# Patient Record
Sex: Female | Born: 1960
Health system: Southern US, Community
[De-identification: ages and names within clinical notes are randomized; demographics above are authoritative.]

## PROBLEM LIST (undated history)

## (undated) DIAGNOSIS — Z8042 Family history of malignant neoplasm of prostate: Secondary | ICD-10-CM

## (undated) DIAGNOSIS — C801 Malignant (primary) neoplasm, unspecified: Secondary | ICD-10-CM

## (undated) DIAGNOSIS — Z803 Family history of malignant neoplasm of breast: Secondary | ICD-10-CM

## (undated) DIAGNOSIS — Z789 Other specified health status: Secondary | ICD-10-CM

## (undated) DIAGNOSIS — J302 Other seasonal allergic rhinitis: Secondary | ICD-10-CM

## (undated) DIAGNOSIS — Z800 Family history of malignant neoplasm of digestive organs: Secondary | ICD-10-CM

## (undated) DIAGNOSIS — M1990 Unspecified osteoarthritis, unspecified site: Secondary | ICD-10-CM

## (undated) HISTORY — PX: OTHER SURGICAL HISTORY: SHX169

## (undated) HISTORY — DX: Family history of malignant neoplasm of prostate: Z80.42

## (undated) HISTORY — DX: Family history of malignant neoplasm of digestive organs: Z80.0

## (undated) HISTORY — DX: Family history of malignant neoplasm of breast: Z80.3

## (undated) HISTORY — PX: TONSILLECTOMY: SUR1361

---

## 1998-04-08 ENCOUNTER — Other Ambulatory Visit: Admission: RE | Admit: 1998-04-08 | Discharge: 1998-04-08 | Payer: Self-pay | Admitting: Obstetrics and Gynecology

## 1998-12-20 DIAGNOSIS — C801 Malignant (primary) neoplasm, unspecified: Secondary | ICD-10-CM

## 1998-12-20 HISTORY — PX: OTHER SURGICAL HISTORY: SHX169

## 1998-12-20 HISTORY — DX: Malignant (primary) neoplasm, unspecified: C80.1

## 1999-04-28 ENCOUNTER — Other Ambulatory Visit: Admission: RE | Admit: 1999-04-28 | Discharge: 1999-04-28 | Payer: Self-pay | Admitting: Obstetrics and Gynecology

## 1999-05-19 ENCOUNTER — Other Ambulatory Visit: Admission: RE | Admit: 1999-05-19 | Discharge: 1999-05-19 | Payer: Self-pay | Admitting: Obstetrics & Gynecology

## 1999-05-20 ENCOUNTER — Other Ambulatory Visit: Admission: RE | Admit: 1999-05-20 | Discharge: 1999-05-20 | Payer: Self-pay | Admitting: Obstetrics & Gynecology

## 1999-10-19 ENCOUNTER — Ambulatory Visit (HOSPITAL_COMMUNITY): Admission: RE | Admit: 1999-10-19 | Discharge: 1999-10-19 | Payer: Self-pay | Admitting: Otolaryngology

## 1999-10-19 ENCOUNTER — Encounter: Payer: Self-pay | Admitting: Otolaryngology

## 1999-10-23 ENCOUNTER — Other Ambulatory Visit: Admission: RE | Admit: 1999-10-23 | Discharge: 1999-10-23 | Payer: Self-pay | Admitting: Otolaryngology

## 1999-10-23 ENCOUNTER — Encounter (INDEPENDENT_AMBULATORY_CARE_PROVIDER_SITE_OTHER): Payer: Self-pay | Admitting: Specialist

## 1999-11-04 ENCOUNTER — Inpatient Hospital Stay (HOSPITAL_COMMUNITY): Admission: RE | Admit: 1999-11-04 | Discharge: 1999-11-14 | Payer: Self-pay | Admitting: Otolaryngology

## 1999-11-04 ENCOUNTER — Encounter (INDEPENDENT_AMBULATORY_CARE_PROVIDER_SITE_OTHER): Payer: Self-pay | Admitting: *Deleted

## 1999-11-25 ENCOUNTER — Encounter: Admission: RE | Admit: 1999-11-25 | Discharge: 2000-02-23 | Payer: Self-pay | Admitting: Radiation Oncology

## 2000-01-19 ENCOUNTER — Other Ambulatory Visit: Admission: RE | Admit: 2000-01-19 | Discharge: 2000-01-19 | Payer: Self-pay | Admitting: *Deleted

## 2000-02-25 ENCOUNTER — Encounter (HOSPITAL_COMMUNITY): Admission: RE | Admit: 2000-02-25 | Discharge: 2000-05-25 | Payer: Self-pay | Admitting: Dentistry

## 2000-06-03 ENCOUNTER — Encounter: Payer: Self-pay | Admitting: Otolaryngology

## 2000-06-03 ENCOUNTER — Ambulatory Visit (HOSPITAL_COMMUNITY): Admission: RE | Admit: 2000-06-03 | Discharge: 2000-06-03 | Payer: Self-pay | Admitting: Otolaryngology

## 2000-10-04 ENCOUNTER — Other Ambulatory Visit: Admission: RE | Admit: 2000-10-04 | Discharge: 2000-10-04 | Payer: Self-pay | Admitting: *Deleted

## 2001-10-17 ENCOUNTER — Other Ambulatory Visit: Admission: RE | Admit: 2001-10-17 | Discharge: 2001-10-17 | Payer: Self-pay | Admitting: Obstetrics and Gynecology

## 2001-11-28 ENCOUNTER — Encounter: Payer: Self-pay | Admitting: Otolaryngology

## 2001-11-28 ENCOUNTER — Ambulatory Visit (HOSPITAL_COMMUNITY): Admission: RE | Admit: 2001-11-28 | Discharge: 2001-11-28 | Payer: Self-pay | Admitting: Otolaryngology

## 2002-11-06 ENCOUNTER — Other Ambulatory Visit: Admission: RE | Admit: 2002-11-06 | Discharge: 2002-11-06 | Payer: Self-pay | Admitting: Obstetrics and Gynecology

## 2003-11-18 ENCOUNTER — Other Ambulatory Visit: Admission: RE | Admit: 2003-11-18 | Discharge: 2003-11-18 | Payer: Self-pay | Admitting: Obstetrics and Gynecology

## 2009-06-07 ENCOUNTER — Emergency Department (HOSPITAL_COMMUNITY): Admission: EM | Admit: 2009-06-07 | Discharge: 2009-06-07 | Payer: Self-pay | Admitting: Emergency Medicine

## 2009-06-07 ENCOUNTER — Encounter: Payer: Self-pay | Admitting: Orthopedic Surgery

## 2009-06-11 ENCOUNTER — Ambulatory Visit: Payer: Self-pay | Admitting: Orthopedic Surgery

## 2009-06-11 DIAGNOSIS — S93609A Unspecified sprain of unspecified foot, initial encounter: Secondary | ICD-10-CM | POA: Insufficient documentation

## 2009-06-13 ENCOUNTER — Encounter: Payer: Self-pay | Admitting: Orthopedic Surgery

## 2009-07-31 ENCOUNTER — Ambulatory Visit: Payer: Self-pay | Admitting: Orthopedic Surgery

## 2009-11-23 ENCOUNTER — Encounter: Admission: RE | Admit: 2009-11-23 | Discharge: 2009-11-23 | Payer: Self-pay | Admitting: Otolaryngology

## 2011-03-02 ENCOUNTER — Other Ambulatory Visit: Payer: Self-pay | Admitting: Obstetrics and Gynecology

## 2011-05-07 NOTE — Op Note (Signed)
[facility]  Patient:    Sherry Rivera                        MRN: [id number] Proc. Date: 11/04/99 Adm. 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  OPERATION PERFORMED: 1. Midline labiomandibulotomy with resection of right base of tone adenoid    cystic carcinoma. 2. Transcervical excision of right submandipular gland. 3. Tracheostomy.  SURGEON:  Kinnie Scales. Annalee Genta, M.D.  ASSISTANT:  Veverly Fells. Arletha Grippe, M.D.  ANESTHESIA:  General endotracheal.  ESTIMATED BLOOD LOSS:  150 cc.  URINE OUTPUT:  250 cc.  FLUID REPLACEMENT:  4 liters of crystalloid.  The patient was transferred from the operating room to the recovery room in stable condition without complication.  BRIEF HISTORY:  [name] is a 50 year old white female who was referred by Dr. Kristen Cardinal for evaluation of a right posterior tongue mass.  The patient underwent diagnostic laryngoscopy and biopsy of the mass approximately two weeks prior to her admission.  Biopsy revealed invasive adenoid cystic carcinoma. MRI scan was obtained prior to the biopsy and this showed approximately a 2 cm mass in the midaspect of the right tongue invading the deep tongue musculature.  There as no other adenopathy or abnormalities noted.  Based on the patients history, examination, MRI scan and biopsy, I recommended he undertake the above surgical  procedure for definitive surgical management of the right base of tongue lesion. The risks, benefits, and possible complications of these procedures were discussed in detail with the patient and her family.  They understood and concurred with ur plans for surgery which was scheduled as outlined above.  DESCRIPTION OF PROCEDURE:  The patient  was brought to the operating room on November 04, 1999 and placed in supine position on the operating room.  General  endotracheal anesthesia was established without difficulty.  When the patient had been adequately anesthetized, she was injected with 50 cc of 1% lidocaine 1:100,000 solution epinephrine in subcutaneous fashion in the anterior neck skin and along the proposed surgical incision from the midline aspect of the lower lip to the submental area and to the lateral aspect of the right neck.  The patient was then prepped and draped in sterile fashion and a tracheostomy was then performed with a #15 scalpel blade and a 2 cm horizontal incision was created in the anterior neck. This was carried through the skin and the underlying subcutaneous tissue. Strap muscles were identified and divided in the midline.  They were then lateralized. The anterior aspect of the thyroid gland was identified, divided and suture ligated across the thyroid isthmus.  This allowed access to the anterior aspect of the trachea.  I dissected the tracheal interspace.  A horizontal incision was created with a #15 scalpel blade.  This was carried through the anterior aspect of the trachea and superior inferior stay sutures were then placed.  A anode endotracheal tube was then placed in the patients trachea and sutured in position to the anterior neck skin.  When good ventilation was established, the remainder of the procedure was undertaken.  The patients oral cavity and oropharynx were examined. She  was found to have a large 2 cm firm mass in the right mid and posterior aspect of the tongue.  Previous biopsy site showed a small ulceration.  There was no palpable adenopathy.  Based on the location of the tumor and planned surgical extirpation, the procedure was begun with excision of the right submandibular gland through a right transcervical approach.  A horizontal incision was created  in the pre-existing skin crease approximately 2 to 3 cm below the margin of the mandible on the right hand side.  This was carried through the skin and underlying subcutaneous tissues.  Platysma muscle was identified and divided and a subplatysmal flap was elevated from inferior to superior.  The marginal mandibular nerve was identified.  It was dissected free from the surrounding tissues and followed from proximal to distal preserving the nerve throughout its course with the marginal mandibular nerve identified and preserved.  Attention was then turned to the submandibular space where the right submandibular gland was identified, he common facial vein was divided and suture ligated and reflected superiorly. The inferior margin of the submandibular gland was identified and the fascia surrounding the gland was dissected along the lateral and deep aspects of the gland.  The facial artery was identified and its contributions to the gland were divided and ligated.  The gland was then retracted inferiorly.  The lingual nerve was identified and its glandular ennervation was also divided and suture ligated. The gland was then mobilized and the myelohyoid muscle was freed and elevated medially.  The right submandibular duct was then traced along the floor of mouth and divided and ligated at the anteriormost aspect of the right floor of mouth.  The submandibular gland was then excised entirely.  The submandibular space was  examined.  There were several enlarged lymph nodes which were removed and sent o pathology for microscopic evaluation.  Frozen section showed no evidence of malignancy.  The remainder of the patients incision was then outlined.  This was a midline labial incision with a Z-plasty in the immediate sublabial region.  The  incision was then carried inferiorly to the midline approximately the level of he hyoid and then connected to the previously described lateral neck  incision from the submandibular gland excision.  This carried through the skin and underlying deep subcutaneous tissue to the level of the midline of the mandible.  The superhyoid musculature on the right hand side was elevated and divided using the Bovie  electrocautery.  The hypoglossal nerve and lingual nerve were identified and were traced from proximal to distal and preserved throughout their course.  The right lower central incisor was then mobilized in its socket and the tooth was extracted without difficulty using an oscillating Stryker saw.  A mandibulotomy was then performed in the midline aspect of the mandible.  This was carried through the anterior posterior tables of the mandible and the right hemimandible was then mobilized laterally to allow direct access to the floor of mouth and tongue base. The incision was then extended along the right lateral gingivoglossal sulcus elevating mucosa and dividing and suture ligating vessels maintaining and preserving the lingual artery, nerve and hypoglossal nerve.  The right submandibular duct was transected.  The previous excision site was isolated in he right submandibular space.  The tongue then was mobilized superiorly.  This allowed direct access to the right lateral tongue tumor.  With 2-0 silk sutures for retraction, the tumor mass was palpated and a large cuff of normal tissue measuring  approximately 1 to 0.5 cm was demarcated around the most superficial aspect of he tumor.  Excision was then carried into the deep aspect of the tongue excising all associated tongue musculature around the tumor.  Approximately 3 x 4 cm of right tongue were removed extending from tongue base to the mid and lateral portions f the anterior tongue.  The resected mass was then sent to pathology for gross and microscopic evaluation.  The posterior lateral margin was taken as a frozen section biopsy and this came back negative for evidence  of malignancy.  The wound was then thoroughly irrigated with antibiotic saline solution and suture ligature and Bovie electrocautery were used to maintain hemostasis.  Various reconstructive options were then discussed.  The possibility of a local regional flap versus split thickness skin graft versus local closure.  Based on the mobility of the tongue and the midaspect of the resection, we felt it was adequate for primary closure. This was done in multiple layers with approximately three layers of deep Vicryl sutures to reapproximate the tongue musculature.  This consisted of 3-0 Vicryl suture on a tapered needle.  The tongue mucosa was then closed with the same stitch in an interrupted fashion extending from medial to the lateral sulcus.  The mucosa was then approximated between the lateral aspect of the tongue and the right lateral floor of mouth again with interrupted 3-0 Vicryl sutures.  The deep floor of mouth was then closed in layers reapproximating the superhyoid musculature creating a  watertight seal along the floor of mouth.  A previously fit 6-hole mandibular reconstruction plate with eccentric dynamic compression was placed over the mandibulotomy site and with multiple screws, we were able to close the mandibular defect with good stabilization.  Again, the patients oral cavity and oropharynx  were thoroughly irrigated with antibiotic saline solution.  A 10 mm Jackson-Pratt drain was placed at the base of the neck incision and the wound was closed in multiple layers beginning with reapproximation of the platysma muscle and deep submental tissues.  The mandibular periosteum was closed in layers with 4-0 Vicryl suture as well.  With approximation of the superficial and deep layers, final skin closure was then achieved using a 5-0 Ethilon suture on the neck in a running interlocked fashion.  5-0 and 6-0 Ethilon were used to close the anterior sublabial skin  intraorally.  The wound was closed in 4-0 and 5-0 Vicryl in interrupted fashion.  An orogastric tube was then placed through the left naris and positioning was confirmed.  With aspiration of stomach contents, the nasogastric tube was sutured into position with 2-0 silk through the lateral nasal ala.  The anode endotracheal tube was removed and a #6 Shiley tracheostomy tube was inserted without difficulty.  This was held in position with tracheostomy stay ties. The patient was then awakened from anesthetic.  The oral cavity and oropharynx were  thoroughly irrigated and suctioned with antibiotic solution.  The wound was cleaned and dressed with bacitracin ointment.  She was then transferred from the operating room to the recovery room in stable condition.  There were no complications. Estimated blood loss was 150 cc. DD:  11/04/99 TD:  11/06/99 Job: [id number]

## 2014-03-15 ENCOUNTER — Ambulatory Visit: Payer: Self-pay | Admitting: Family Medicine

## 2016-01-14 NOTE — H&P (Signed)
  NTS SOAP Note  Vital Signs:  Vitals as of: 99991111: Systolic Q000111Q: Diastolic 87: Heart Rate 99: Temp 98.70F (Temporal): Height 57ft 4.5in: Weight 175Lbs 0 Ounces: BMI 29.57   BMI : 29.57 kg/m2  Subjective: This 55 year old female presents for of need for screening TCS.  Denies any lower gi complaints.  Never has had a TCS.  No family h/o colon cancer.  Review of Symptoms:  Constitutional:unremarkable   headache Eyes:unremarkable   Nose/Mouth/Throat:unremarkable Cardiovascular:  unremarkable Respiratory:unremarkable Gastrointestinabdominal pain, dyspepsia Genitourinary:urinary hesitancy Musculoskeletal:unremarkable Skin:unremarkable Hematolgic/Lymphatic:unremarkable   Allergic/Immunologic:unremarkable   Past Medical History:  Reviewed  Past Medical History  Surgical History: tumor excision of tongue 2000 Medical Problems: unremarkable Allergies: nkda Medications: tarmeric, vit d   Social History:Reviewed  Social History  Preferred Language: English Race:  White Ethnicity: Not Hispanic / Latino Age: 55 year Marital Status:  M Alcohol: no   Smoking Status: Current every day smoker reviewed on 01/01/2016 Started Date:  Packs per week:  Functional Status reviewed on 01/01/2016 ------------------------------------------------ Bathing: Normal Cooking: Normal Dressing: Normal Driving: Normal Eating: Normal Managing Meds: Normal Oral Care: Normal Shopping: Normal Toileting: Normal Transferring: Normal Walking: Normal Cognitive Status reviewed on 01/01/2016 ------------------------------------------------ Attention: Normal Decision Making: Normal Language: Normal Memory: Normal Motor: Normal Perception: Normal Problem Solving: Normal Visual and Spatial: Normal   Family History:Reviewed  Family Health History Mother, Living; Breast cancer;  Father, Living; Urinary bladder cancer; Prostate cancer;     Objective  Information: General:Well appearing, well nourished in no distress. Heart:RRR, no murmur Lungs:  CTA bilaterally, no wheezes, rhonchi, rales.  Breathing unlabored. Abdomen:Soft, NT/ND, no HSM, no masses. deferred to procedure  Assessment:Need for screening TCS  Diagnoses: V76.51  Z12.11 Screening for malignant neoplasm of colon (Encounter for screening for malignant neoplasm of colon)  Procedures: [id number] - OFFICE OUTPATIENT NEW 20 MINUTES    Plan:  Scheduled for screening TCS on 02/10/16.   Patient Education:Alternative treatments to surgery were discussed with patient (and family).  Risks and benefits  of procedure incluidng bleeding and perforation were fully explained to the patient (and family) who gave informed consent. Patient/family questions were addressed.  Follow-up:Pending Surgery

## 2016-02-10 ENCOUNTER — Encounter (HOSPITAL_COMMUNITY): Payer: Self-pay | Admitting: *Deleted

## 2016-02-10 ENCOUNTER — Encounter (HOSPITAL_COMMUNITY)
Admission: RE | Disposition: A | Discharge status: Home / Self Care | Payer: Self-pay | Source: Ambulatory Visit | Attending: General Surgery

## 2016-02-10 ENCOUNTER — Ambulatory Visit (HOSPITAL_COMMUNITY)
Admission: RE | Admit: 2016-02-10 | Discharge: 2016-02-10 | Disposition: A | Discharge status: Home / Self Care | Payer: 59 | Source: Ambulatory Visit | Attending: General Surgery | Admitting: General Surgery

## 2016-02-10 DIAGNOSIS — K5730 Diverticulosis of large intestine without perforation or abscess without bleeding: Secondary | ICD-10-CM | POA: Insufficient documentation

## 2016-02-10 DIAGNOSIS — D128 Benign neoplasm of rectum: Secondary | ICD-10-CM | POA: Insufficient documentation

## 2016-02-10 DIAGNOSIS — F17200 Nicotine dependence, unspecified, uncomplicated: Secondary | ICD-10-CM | POA: Diagnosis not present

## 2016-02-10 DIAGNOSIS — Z1211 Encounter for screening for malignant neoplasm of colon: Secondary | ICD-10-CM | POA: Insufficient documentation

## 2016-02-10 DIAGNOSIS — D123 Benign neoplasm of transverse colon: Secondary | ICD-10-CM | POA: Insufficient documentation

## 2016-02-10 HISTORY — DX: Other specified health status: Z78.9

## 2016-02-10 HISTORY — PX: COLONOSCOPY: SHX5424

## 2016-02-10 SURGERY — COLONOSCOPY
Anesthesia: Moderate Sedation

## 2016-02-10 MED ORDER — ATROPINE SULFATE 1 MG/ML IJ SOLN
INTRAMUSCULAR | Status: DC | PRN
  Administered 2016-02-10: .5 mg via INTRAVENOUS

## 2016-02-10 MED ORDER — ATROPINE SULFATE 1 MG/ML IJ SOLN
INTRAMUSCULAR | Status: AC
  Filled 2016-02-10: qty 1

## 2016-02-10 MED ORDER — MIDAZOLAM HCL 5 MG/5ML IJ SOLN
INTRAMUSCULAR | Status: AC
  Filled 2016-02-10: qty 5

## 2016-02-10 MED ORDER — MEPERIDINE HCL 50 MG/ML IJ SOLN
INTRAMUSCULAR | Status: DC | PRN
  Administered 2016-02-10: 50 mg via INTRAVENOUS

## 2016-02-10 MED ORDER — STERILE WATER FOR IRRIGATION IR SOLN
Status: DC | PRN
  Administered 2016-02-10: 08:00:00

## 2016-02-10 MED ORDER — MEPERIDINE HCL 50 MG/ML IJ SOLN
INTRAMUSCULAR | Status: AC
  Filled 2016-02-10: qty 1

## 2016-02-10 MED ORDER — SODIUM CHLORIDE 0.9 % IV SOLN
INTRAVENOUS | Status: DC
  Administered 2016-02-10: 07:00:00 via INTRAVENOUS

## 2016-02-10 MED ORDER — MIDAZOLAM HCL 5 MG/5ML IJ SOLN
INTRAMUSCULAR | Status: DC | PRN
  Administered 2016-02-10: 3 mg via INTRAVENOUS

## 2016-02-10 NOTE — Interval H&P Note (Signed)
History and Physical Interval Note:  02/10/2016 7:31 AM  Sherry Rivera  has presented today for surgery, with the diagnosis of screening  The various methods of treatment have been discussed with the patient and family. After consideration of risks, benefits and other options for treatment, the patient has consented to  Procedure(s): COLONOSCOPY (N/A) as a surgical intervention .  The patient's history has been reviewed, patient examined, no change in status, stable for surgery.  I have reviewed the patient's chart and labs.  Questions were answered to the patient's satisfaction.     Aviva Signs A

## 2016-02-10 NOTE — Discharge Instructions (Signed)
Colonoscopy, Care After °Refer to this sheet in the next few weeks. These instructions provide you with information on caring for yourself after your procedure. Your health care provider may also give you more specific instructions. Your treatment has been planned according to current medical practices, but problems sometimes occur. Call your health care provider if you have any problems or questions after your procedure. °WHAT TO EXPECT AFTER THE PROCEDURE  °After your procedure, it is typical to have the following: °· A small amount of blood in your stool. °· Moderate amounts of gas and mild abdominal cramping or bloating. °HOME CARE INSTRUCTIONS °· Do not drive, operate machinery, or sign important documents for 24 hours. °· You may shower and resume your regular physical activities, but move at a slower pace for the first 24 hours. °· Take frequent rest periods for the first 24 hours. °· Walk around or put a warm pack on your abdomen to help reduce abdominal cramping and bloating. °· Drink enough fluids to keep your urine clear or pale yellow. °· You may resume your normal diet as instructed by your health care provider. Avoid heavy or fried foods that are hard to digest. °· Avoid drinking alcohol for 24 hours or as instructed by your health care provider. °· Only take over-the-counter or prescription medicines as directed by your health care provider. °· If a tissue sample (biopsy) was taken during your procedure: °¨ Do not take aspirin or blood thinners for 7 days, or as instructed by your health care provider. °¨ Do not drink alcohol for 7 days, or as instructed by your health care provider. °¨ Eat soft foods for the first 24 hours. °SEEK MEDICAL CARE IF: °You have persistent spotting of blood in your stool 2-3 days after the procedure. °SEEK IMMEDIATE MEDICAL CARE IF: °· You have more than a small spotting of blood in your stool. °· You pass large blood clots in your stool. °· Your abdomen is swollen  (distended). °· You have nausea or vomiting. °· You have a fever. °· You have increasing abdominal pain that is not relieved with medicine. °  °This information is not intended to replace advice given to you by your health care provider. Make sure you discuss any questions you have with your health care provider. °  °Document Released: 07/20/2004 Document Revised: 09/26/2013 Document Reviewed: 08/13/2013 °Elsevier Interactive Patient Education ©2016 [employer] ° °

## 2016-02-10 NOTE — Op Note (Signed)
[place], [address]   COLONOSCOPY PROCEDURE REPORT     EXAM DATE: 2016/03/06  PATIENT NAME:      Sherry Rivera, Sherry Rivera           MR #:      [id number]  BIRTHDATE:       Oct 11, 1961      VISIT #:     [phone]  ATTENDING:     Aviva Signs, MD     STATUS:     outpatient ASSISTANT:  INDICATIONS:  The patient is a 55 yr old female here for a colonoscopy due to average risk patient for colon cancer. PROCEDURE PERFORMED:     Colonoscopy with snare polypectomy MEDICATIONS:     Demerol 50 mg IV, Versed 3 mg IV, and Atropine .5 mg IV ESTIMATED BLOOD LOSS:     None  CONSENT: The patient understands the risks and benefits of the procedure and understands that these risks include, but are not limited to: sedation, allergic reaction, infection, perforation and/or bleeding. Alternative means of evaluation and treatment include, among others: physical exam, x-rays, and/or surgical intervention. The patient elects to proceed with this endoscopic procedure.  DESCRIPTION OF PROCEDURE: During intra-op preparation period all mechanical & medical equipment was checked for proper function. Hand hygiene and appropriate measures for infection prevention was taken. After the risks, benefits and alternatives of the procedure were thoroughly explained, Informed consent was verified, confirmed and timeout was successfully executed by the treatment team. A digital exam revealed no abnormalities of the rectum. The [id number] endoscope was introduced through the anus and advanced to the cecum, which was identified by both the appendix and ileocecal valve. adequate (Trilyte was used) The instrument was then slowly withdrawn as the colon was fully examined.Estimated blood loss is zero unless otherwise noted in this procedure report.   COLON FINDINGS: There was mild diverticulosis noted in the descending colon.   A sessile polyp ranging between 3-58mm in  size with a friable surface was found in the distal transverse colon.  A polypectomy was performed using snare cautery.  The resection was complete, the polyp tissue was completely retrieved and sent to histology.   Three sessile polyps ranging between 3-51mm in size with friable surfaces were found in the rectum.  Polypectomies were performed using snare cautery.  The resection was complete, the polyp tissue was completely retrieved and sent to histology.   The examination was otherwise normal. Retroflexion was not performed due to a narrow rectal vault. The scope was then completely withdrawn from the patient and the procedure terminated. SCOPE WITHDRAWAL TIME: 16    ADVERSE EVENTS:      There were no immediate complications.  IMPRESSIONS:     1.  Mild diverticulosis was noted in the descending colon 2.  Sessile polyp ranging between 3-87mm in size was found in the distal transverse colon; polypectomy was performed using snare cautery 3.  Three sessile polyps ranging between 3-83mm in size were found in the rectum; polypectomies were performed using snare cautery 4.  The examination was otherwise normal  RECOMMENDATIONS:     1.  Await pathology results 2.  Repeat Colonoscopy in 3 years. RECALL:  _____________________________ Aviva Signs, MD eSigned:  Aviva Signs, MD 2016-03-06 8:03 AM   cc:   CPT CODES: ICD CODES:  The ICD and CPT codes recommended by this software are interpretations from the data that the clinical staff has captured with the software.  The verification  of the translation of this report to the ICD and CPT codes and modifiers is the sole responsibility of the health care institution and practicing physician where this report was generated.  [employer] will not be held responsible for the validity of the ICD and CPT codes included on this report.  AMA assumes no liability for data contained or not contained herein. CPT is a  Designer, television/film set of the [employer].   PATIENT NAME:  [name], [name] MR#: [id number]

## 2016-02-10 NOTE — Progress Notes (Signed)
Per VO Dr Arnoldo Morale; give 0.5 mg atropine now IV for decreased heartrate of 53; confirmed verbal order and administered atropine as ordered.

## 2016-02-12 ENCOUNTER — Encounter (HOSPITAL_COMMUNITY): Payer: Self-pay | Admitting: General Surgery

## 2017-01-22 DIAGNOSIS — Z803 Family history of malignant neoplasm of breast: Secondary | ICD-10-CM | POA: Diagnosis not present

## 2017-01-22 DIAGNOSIS — Z1231 Encounter for screening mammogram for malignant neoplasm of breast: Secondary | ICD-10-CM | POA: Diagnosis not present

## 2017-07-04 DIAGNOSIS — Z01419 Encounter for gynecological examination (general) (routine) without abnormal findings: Secondary | ICD-10-CM | POA: Diagnosis not present

## 2017-08-25 DIAGNOSIS — L821 Other seborrheic keratosis: Secondary | ICD-10-CM | POA: Diagnosis not present

## 2017-08-25 DIAGNOSIS — C44329 Squamous cell carcinoma of skin of other parts of face: Secondary | ICD-10-CM | POA: Diagnosis not present

## 2017-08-25 DIAGNOSIS — L720 Epidermal cyst: Secondary | ICD-10-CM | POA: Diagnosis not present

## 2017-10-07 DIAGNOSIS — Z23 Encounter for immunization: Secondary | ICD-10-CM | POA: Diagnosis not present

## 2017-10-13 DIAGNOSIS — Z08 Encounter for follow-up examination after completed treatment for malignant neoplasm: Secondary | ICD-10-CM | POA: Diagnosis not present

## 2017-10-13 DIAGNOSIS — D2239 Melanocytic nevi of other parts of face: Secondary | ICD-10-CM | POA: Diagnosis not present

## 2017-10-13 DIAGNOSIS — Z85828 Personal history of other malignant neoplasm of skin: Secondary | ICD-10-CM | POA: Diagnosis not present

## 2018-01-27 DIAGNOSIS — Z803 Family history of malignant neoplasm of breast: Secondary | ICD-10-CM | POA: Diagnosis not present

## 2018-01-27 DIAGNOSIS — Z1231 Encounter for screening mammogram for malignant neoplasm of breast: Secondary | ICD-10-CM | POA: Diagnosis not present

## 2018-07-20 DIAGNOSIS — Z01419 Encounter for gynecological examination (general) (routine) without abnormal findings: Secondary | ICD-10-CM | POA: Diagnosis not present

## 2018-08-11 ENCOUNTER — Other Ambulatory Visit (HOSPITAL_COMMUNITY): Payer: Self-pay | Admitting: Family Medicine

## 2018-08-11 DIAGNOSIS — Z0001 Encounter for general adult medical examination with abnormal findings: Secondary | ICD-10-CM | POA: Diagnosis not present

## 2018-08-11 DIAGNOSIS — R0989 Other specified symptoms and signs involving the circulatory and respiratory systems: Secondary | ICD-10-CM

## 2018-08-14 DIAGNOSIS — Z0001 Encounter for general adult medical examination with abnormal findings: Secondary | ICD-10-CM | POA: Diagnosis not present

## 2018-08-14 DIAGNOSIS — Z1389 Encounter for screening for other disorder: Secondary | ICD-10-CM | POA: Diagnosis not present

## 2018-08-14 DIAGNOSIS — R739 Hyperglycemia, unspecified: Secondary | ICD-10-CM | POA: Diagnosis not present

## 2018-08-14 DIAGNOSIS — R0989 Other specified symptoms and signs involving the circulatory and respiratory systems: Secondary | ICD-10-CM | POA: Diagnosis not present

## 2018-08-22 ENCOUNTER — Other Ambulatory Visit (HOSPITAL_COMMUNITY): Payer: Self-pay | Admitting: Family Medicine

## 2018-08-22 ENCOUNTER — Ambulatory Visit (HOSPITAL_COMMUNITY)
Admission: RE | Admit: 2018-08-22 | Discharge: 2018-08-22 | Disposition: A | Discharge status: Home / Self Care | Payer: 59 | Source: Ambulatory Visit | Attending: Family Medicine | Admitting: Family Medicine

## 2018-08-22 DIAGNOSIS — I700 Atherosclerosis of aorta: Secondary | ICD-10-CM | POA: Insufficient documentation

## 2018-08-22 DIAGNOSIS — R0989 Other specified symptoms and signs involving the circulatory and respiratory systems: Secondary | ICD-10-CM | POA: Insufficient documentation

## 2018-09-20 DIAGNOSIS — Z23 Encounter for immunization: Secondary | ICD-10-CM | POA: Diagnosis not present

## 2019-01-29 DIAGNOSIS — Z1231 Encounter for screening mammogram for malignant neoplasm of breast: Secondary | ICD-10-CM | POA: Diagnosis not present

## 2019-02-09 DIAGNOSIS — R922 Inconclusive mammogram: Secondary | ICD-10-CM | POA: Diagnosis not present

## 2019-02-09 DIAGNOSIS — N6489 Other specified disorders of breast: Secondary | ICD-10-CM | POA: Diagnosis not present

## 2019-02-09 DIAGNOSIS — N6312 Unspecified lump in the right breast, upper inner quadrant: Secondary | ICD-10-CM | POA: Diagnosis not present

## 2019-02-18 DIAGNOSIS — C801 Malignant (primary) neoplasm, unspecified: Secondary | ICD-10-CM

## 2019-02-18 HISTORY — DX: Malignant (primary) neoplasm, unspecified: C80.1

## 2019-02-19 ENCOUNTER — Other Ambulatory Visit: Payer: Self-pay | Admitting: Radiology

## 2019-02-19 DIAGNOSIS — C50811 Malignant neoplasm of overlapping sites of right female breast: Secondary | ICD-10-CM | POA: Diagnosis not present

## 2019-02-19 DIAGNOSIS — N6315 Unspecified lump in the right breast, overlapping quadrants: Secondary | ICD-10-CM | POA: Diagnosis not present

## 2019-02-21 ENCOUNTER — Telehealth: Payer: Self-pay | Admitting: Hematology and Oncology

## 2019-02-21 NOTE — Telephone Encounter (Signed)
Spoke to patient to confirm morning [facility] appointment for 3/11, [facility] patient no packet sent

## 2019-02-26 ENCOUNTER — Encounter: Payer: Self-pay | Admitting: *Deleted

## 2019-02-26 DIAGNOSIS — Z170 Estrogen receptor positive status [ER+]: Principal | ICD-10-CM

## 2019-02-26 DIAGNOSIS — C50411 Malignant neoplasm of upper-outer quadrant of right female breast: Secondary | ICD-10-CM

## 2019-02-27 ENCOUNTER — Encounter: Payer: Self-pay | Admitting: Licensed Clinical Social Worker

## 2019-02-27 NOTE — Progress Notes (Signed)
[place] CONSULT NOTE  Patient Care Team: [place], [place] as PCP - General (Family Medicine) Mauro Kaufmann, RN as Oncology Nurse Navigator Rockwell Germany, RN as Oncology Nurse Navigator  CHIEF COMPLAINTS/PURPOSE OF CONSULTATION: Newly diagnosed breast cancer  HISTORY OF PRESENTING ILLNESS:  Sherry Rivera 58 y.o. female is here because of recent diagnosis of invasive ductal carcinoma of the right breast. The cancer was detected on a screening mammogram on 01/29/19 and was not palpable prior to diagnosis. A diagnostic mammogram and [place] from 02/09/19 showed a 1.5cm distortion in the right breast and US showed no abnormalities in the right axilla. A biopsy from 02/19/19 showed the cancer to be grade 2 IDC HER2 negative, ER 95%, PR 40%, Ki67 5%.   She presents to the clinic today with her husband. She reports a family history of breast cancer in her mother at 16 and paternal aunt at 39. She previously had tongue cancer in 2000 treated with surgery and radiation.   I reviewed her records extensively and collaborated the history with the patient.  SUMMARY OF ONCOLOGIC HISTORY:   Malignant neoplasm of upper-outer quadrant of right breast in female, estrogen receptor positive ([place])   02/19/2019 Initial Diagnosis    Screening detected right breast asymmetry by ultrasound measured 7 mm, biopsy revealed grade 2 IDC ER 95%, PR 40%, Ki-67 5%, HER-2 1+ negative, T1BN0 stage Ia clinical stage    02/28/2019 Cancer Staging    Staging form: Breast, AJCC 8th Edition - Clinical stage from 02/28/2019: Stage IA (cT1b, cN0, cM0, G2, ER+, PR+, HER2-) - Signed by Nicholas Lose, MD on 02/28/2019     MEDICAL HISTORY:  Past Medical History:  Diagnosis Date  . Medical history non-contributory     SURGICAL HISTORY: Past Surgical History:  Procedure Laterality Date  . COLONOSCOPY N/A 02/10/2016   Procedure: COLONOSCOPY;  Surgeon: Aviva Signs, MD;  Location: [facility];   Service: Gastroenterology;  Laterality: N/A;  . laser surgery on cervix    . TONSILLECTOMY    . tumor on tongue removed  2000    SOCIAL HISTORY: Social History   Socioeconomic History  . Marital status: Married    Spouse name: Not on file  . Number of children: Not on file  . Years of education: Not on file  . Highest education level: Not on file  Occupational History  . Not on file  Social Needs  . Financial resource strain: Not on file  . Food insecurity:    Worry: Not on file    Inability: Not on file  . Transportation needs:    Medical: Not on file    Non-medical: Not on file  Tobacco Use  . Smoking status: Former Smoker    Packs/day: 1.00    Years: 36.00    Pack years: 36.00    Types: Cigarettes    Last attempt to quit: 02/18/2019    Years since quitting: 0.0  . Smokeless tobacco: Never Used  Substance and Sexual Activity  . Alcohol use: Not Currently    Comment: Rarely  . Drug use: No  . Sexual activity: Not on file  Lifestyle  . Physical activity:    Days per week: Not on file    Minutes per session: Not on file  . Stress: Not on file  Relationships  . Social connections:    Talks on phone: Not on file    Gets together: Not on file    Attends religious  service: Not on file    Active member of club or organization: Not on file    Attends meetings of clubs or organizations: Not on file    Relationship status: Not on file  . Intimate partner violence:    Fear of current or ex partner: Not on file    Emotionally abused: Not on file    Physically abused: Not on file    Forced sexual activity: Not on file  Other Topics Concern  . Not on file  Social History Narrative  . Not on file    FAMILY HISTORY: Family History  Problem Relation Age of Onset  . Colon cancer Other   . Breast cancer Mother   . Bladder Cancer Father   . Prostate cancer Maternal Grandfather   . Colon cancer Paternal Grandmother   . Breast cancer Paternal Aunt   . Breast cancer  Cousin     ALLERGIES:  has No Known Allergies.  MEDICATIONS:  Current Outpatient Medications  Medication Sig Dispense Refill  . acetaminophen (TYLENOL) 500 MG tablet Take 1,000 mg by mouth every 6 (six) hours as needed for headache.    . calcium carbonate (OSCAL) 1500 (600 Ca) MG TABS tablet Take 600 mg of elemental calcium by mouth daily.    Marland Kitchen glucosamine-chondroitin 500-400 MG tablet Take 1 tablet by mouth 3 (three) times daily.    . nicotine polacrilex (COMMIT) 2 MG lozenge Take 2 mg by mouth as needed for smoking cessation.    [name] 3-6-9 Fatty Acids (OMEGA 3-6-9 PO) Take 1 capsule by mouth daily.    . pseudoephedrine (SUDAFED) 60 MG tablet Take 60 mg by mouth every 4 (four) hours as needed for congestion.    . TURMERIC PO Take 1 tablet by mouth daily.     No current facility-administered medications for this visit.     REVIEW OF SYSTEMS:   Constitutional: Denies fevers, chills or abnormal night sweats Eyes: Denies blurriness of vision, double vision or watery eyes Ears, nose, mouth, throat, and face: Denies mucositis or sore throat Respiratory: Denies cough, dyspnea or wheezes Cardiovascular: Denies palpitation, chest discomfort or lower extremity swelling Gastrointestinal:  Denies nausea, heartburn or change in bowel habits Skin: Denies abnormal skin rashes Lymphatics: Denies new lymphadenopathy or easy bruising Neurological:Denies numbness, tingling or new weaknesses Behavioral/Psych: Mood is stable, no new changes  Breast: Denies any palpable lumps or discharge All other systems were reviewed with the patient and are negative.  PHYSICAL EXAMINATION: ECOG PERFORMANCE STATUS: 1 - Symptomatic but completely ambulatory  Vitals:   02/28/19 0841  BP: (!) 141/85  Pulse: 90  Resp: 18  Temp: 97.6 F (36.4 C)  SpO2: 99%   Filed Weights   02/28/19 0841  Weight: 168 lb 12.8 oz (76.6 kg)    GENERAL:alert, no distress and comfortable SKIN: skin color, texture, turgor  are normal, no rashes or significant lesions EYES: normal, conjunctiva are pink and non-injected, sclera clear OROPHARYNX:no exudate, no erythema and lips, buccal mucosa, and tongue normal  NECK: supple, thyroid normal size, non-tender, without nodularity LYMPH:  no palpable lymphadenopathy in the cervical, axillary or inguinal LUNGS: clear to auscultation and percussion with normal breathing effort HEART: regular rate & rhythm and no murmurs and no lower extremity edema ABDOMEN:abdomen soft, non-tender and normal bowel sounds Musculoskeletal:no cyanosis of digits and no clubbing  PSYCH: alert & oriented x 3 with fluent speech NEURO: no focal motor/sensory deficits BREAST: No palpable nodules in breast. No palpable axillary or supraclavicular  lymphadenopathy (exam performed in the presence of a chaperone)   LABORATORY DATA:  I have reviewed the data as listed Lab Results  Component Value Date   WBC 6.5 02/28/2019   HGB 13.8 02/28/2019   HCT 42.8 02/28/2019   MCV 94.9 02/28/2019   PLT 235 02/28/2019   Lab Results  Component Value Date   NA 144 02/28/2019   K 4.2 02/28/2019   CL 109 02/28/2019   CO2 25 02/28/2019    RADIOGRAPHIC STUDIES: I have personally reviewed the radiological reports and agreed with the findings in the report.  ASSESSMENT AND PLAN:  Malignant neoplasm of upper-outer quadrant of right breast in female, estrogen receptor positive ([place]) 02/19/2019:Screening detected right breast asymmetry by ultrasound measured 7 mm, biopsy revealed grade 2 IDC ER 95%, PR 40%, Ki-67 5%, HER-2 1+ negative, T1BN0 stage Ia clinical stage  Pathology and radiology counseling:Discussed with the patient, the details of pathology including the type of breast cancer,the clinical staging, the significance of ER, PR and HER-2/neu receptors and the implications for treatment. After reviewing the pathology in detail, we proceeded to discuss the different treatment options between surgery,  radiation, chemotherapy, antiestrogen therapies.  Recommendations: 1. Breast conserving surgery followed by 2. Oncotype DX testing to determine if chemotherapy would be of any benefit followed by 3. Adjuvant radiation therapy followed by 4. Adjuvant antiestrogen therapy  Oncotype counseling: I discussed Oncotype DX test. I explained to the patient that this is a 21 gene panel to evaluate patient tumors DNA to calculate recurrence score. This would help determine whether patient has high risk or intermediate risk or low risk breast cancer. She understands that if her tumor was found to be high risk, she would benefit from systemic chemotherapy. If low risk, no need of chemotherapy. If she was found to be intermediate risk, we would need to evaluate the score as well as other risk factors and determine if an abbreviated chemotherapy may be of benefit.  Return to clinic after surgery to discuss final pathology report and then determine if Oncotype DX testing will need to be sent.  All questions were answered. The patient knows to call the clinic with any problems, questions or concerns.  Nicholas Lose, MD 02/28/2019   I, Cloyde Reams Dorshimer, am acting as scribe for Nicholas Lose, MD.  I have reviewed the above documentation for accuracy and completeness, and I agree with the above.

## 2019-02-28 ENCOUNTER — Encounter: Payer: Self-pay | Admitting: Hematology and Oncology

## 2019-02-28 ENCOUNTER — Ambulatory Visit
Admission: RE | Admit: 2019-02-28 | Discharge: 2019-02-28 | Disposition: A | Discharge status: Home / Self Care | Payer: 59 | Source: Ambulatory Visit | Attending: Radiation Oncology | Admitting: Radiation Oncology

## 2019-02-28 ENCOUNTER — Inpatient Hospital Stay: Payer: 59

## 2019-02-28 ENCOUNTER — Encounter: Payer: Self-pay | Admitting: Radiation Oncology

## 2019-02-28 ENCOUNTER — Ambulatory Visit (HOSPITAL_BASED_OUTPATIENT_CLINIC_OR_DEPARTMENT_OTHER): Payer: 59 | Admitting: Licensed Clinical Social Worker

## 2019-02-28 ENCOUNTER — Ambulatory Visit: Payer: 59 | Attending: General Surgery | Admitting: Physical Therapy

## 2019-02-28 ENCOUNTER — Other Ambulatory Visit: Payer: Self-pay | Admitting: General Surgery

## 2019-02-28 ENCOUNTER — Inpatient Hospital Stay (HOSPITAL_BASED_OUTPATIENT_CLINIC_OR_DEPARTMENT_OTHER): Payer: 59 | Admitting: Hematology and Oncology

## 2019-02-28 ENCOUNTER — Other Ambulatory Visit: Payer: Self-pay

## 2019-02-28 ENCOUNTER — Encounter: Payer: Self-pay | Admitting: Physical Therapy

## 2019-02-28 ENCOUNTER — Encounter: Payer: Self-pay | Admitting: Licensed Clinical Social Worker

## 2019-02-28 DIAGNOSIS — Z170 Estrogen receptor positive status [ER+]: Principal | ICD-10-CM

## 2019-02-28 DIAGNOSIS — Z85810 Personal history of malignant neoplasm of tongue: Secondary | ICD-10-CM

## 2019-02-28 DIAGNOSIS — C50411 Malignant neoplasm of upper-outer quadrant of right female breast: Secondary | ICD-10-CM | POA: Insufficient documentation

## 2019-02-28 DIAGNOSIS — Z923 Personal history of irradiation: Secondary | ICD-10-CM | POA: Diagnosis not present

## 2019-02-28 DIAGNOSIS — Z803 Family history of malignant neoplasm of breast: Secondary | ICD-10-CM | POA: Diagnosis not present

## 2019-02-28 DIAGNOSIS — R293 Abnormal posture: Secondary | ICD-10-CM | POA: Insufficient documentation

## 2019-02-28 DIAGNOSIS — C50211 Malignant neoplasm of upper-inner quadrant of right female breast: Secondary | ICD-10-CM | POA: Diagnosis not present

## 2019-02-28 DIAGNOSIS — Z8042 Family history of malignant neoplasm of prostate: Secondary | ICD-10-CM | POA: Insufficient documentation

## 2019-02-28 DIAGNOSIS — Z800 Family history of malignant neoplasm of digestive organs: Secondary | ICD-10-CM

## 2019-02-28 DIAGNOSIS — Z1379 Encounter for other screening for genetic and chromosomal anomalies: Secondary | ICD-10-CM

## 2019-02-28 LAB — CBC WITH DIFFERENTIAL (CANCER CENTER ONLY)
Abs Immature Granulocytes: 0.01 10*3/uL (ref 0.00–0.07)
BASOS ABS: 0 10*3/uL (ref 0.0–0.1)
Basophils Relative: 1 %
Eosinophils Absolute: 0.1 10*3/uL (ref 0.0–0.5)
Eosinophils Relative: 1 %
HCT: 42.8 % (ref 36.0–46.0)
HEMOGLOBIN: 13.8 g/dL (ref 12.0–15.0)
IMMATURE GRANULOCYTES: 0 %
LYMPHS PCT: 39 %
Lymphs Abs: 2.5 10*3/uL (ref 0.7–4.0)
MCH: 30.6 pg (ref 26.0–34.0)
MCHC: 32.2 g/dL (ref 30.0–36.0)
MCV: 94.9 fL (ref 80.0–100.0)
Monocytes Absolute: 0.5 10*3/uL (ref 0.1–1.0)
Monocytes Relative: 8 %
Neutro Abs: 3.4 10*3/uL (ref 1.7–7.7)
Neutrophils Relative %: 51 %
PLATELETS: 235 10*3/uL (ref 150–400)
RBC: 4.51 MIL/uL (ref 3.87–5.11)
RDW: 12.1 % (ref 11.5–15.5)
WBC: 6.5 10*3/uL (ref 4.0–10.5)
nRBC: 0 % (ref 0.0–0.2)

## 2019-02-28 LAB — CMP (CANCER CENTER ONLY)
ALBUMIN: 3.9 g/dL (ref 3.5–5.0)
ALT: 8 U/L (ref 0–44)
ANION GAP: 10 (ref 5–15)
AST: 11 U/L — ABNORMAL LOW (ref 15–41)
Alkaline Phosphatase: 54 U/L (ref 38–126)
BILIRUBIN TOTAL: 0.6 mg/dL (ref 0.3–1.2)
BUN: 12 mg/dL (ref 6–20)
CHLORIDE: 109 mmol/L (ref 98–111)
CO2: 25 mmol/L (ref 22–32)
Calcium: 8.8 mg/dL — ABNORMAL LOW (ref 8.9–10.3)
Creatinine: 0.74 mg/dL (ref 0.44–1.00)
GFR, Est AFR Am: >60 mL/min (ref >60)
GFR, Estimated: >60 mL/min (ref >60)
GLUCOSE: 111 mg/dL — AB (ref 70–99)
POTASSIUM: 4.2 mmol/L (ref 3.5–5.1)
SODIUM: 144 mmol/L (ref 135–145)
TOTAL PROTEIN: 6.8 g/dL (ref 6.5–8.1)

## 2019-02-28 NOTE — Assessment & Plan Note (Addendum)
02/19/2019:Screening detected right breast asymmetry by ultrasound measured 7 mm, biopsy revealed grade 2 IDC ER 95%, PR 40%, Ki-67 5%, HER-2 1+ negative, T1BN0 stage Ia clinical stage  Pathology and radiology counseling:Discussed with the patient, the details of pathology including the type of breast cancer,the clinical staging, the significance of ER, PR and HER-2/neu receptors and the implications for treatment. After reviewing the pathology in detail, we proceeded to discuss the different treatment options between surgery, radiation, chemotherapy, antiestrogen therapies.  Recommendations: 1. Breast conserving surgery followed by 2. Oncotype DX testing to determine if chemotherapy would be of any benefit followed by 3. Adjuvant radiation therapy followed by 4. Adjuvant antiestrogen therapy  Oncotype counseling: I discussed Oncotype DX test. I explained to the patient that this is a 21 gene panel to evaluate patient tumors DNA to calculate recurrence score. This would help determine whether patient has high risk or intermediate risk or low risk breast cancer. She understands that if her tumor was found to be high risk, she would benefit from systemic chemotherapy. If low risk, no need of chemotherapy. If she was found to be intermediate risk, we would need to evaluate the score as well as other risk factors and determine if an abbreviated chemotherapy may be of benefit.  Return to clinic after surgery to discuss final pathology report and then determine if Oncotype DX testing will need to be sent.

## 2019-02-28 NOTE — Patient Instructions (Signed)

## 2019-02-28 NOTE — Progress Notes (Unsigned)
Nutrition Assessment  Reason for Assessment:  Pt seen in Breast Clinic  ASSESSMENT:   58 year old female with new diagnosis of breast cancer.  Past medical history reviewed.  Patient reports normal appetite.  Medications:  reviewed  Labs: reviewed  Anthropometrics:   Height: 65 inches Weight: 168 lb  BMI: 28   NUTRITION DIAGNOSIS: Food and nutrition related knowledge deficit related to new diagnosis of breast cancer as evidenced by no prior need for nutrition related information.  INTERVENTION:   Discussed and provided packet of information regarding nutritional tips for breast cancer patients.  Questions answered.  Teachback method used.  Contact information provided and patient knows to contact me with questions/concerns.    MONITORING, EVALUATION, and GOAL: Pt will consume a healthy plant based diet to maintain lean body mass throughout treatment.   Dazani Norby B. Zenia Resides, [place], [place] Registered Dietitian [phone] (pager)

## 2019-02-28 NOTE — Progress Notes (Signed)
Radiation Oncology         [phone] ________________________________  Initial outpatient Consultation  Name: Sherry Rivera MRN: [id number]  Date: 02/28/2019  DOB: 25-Jun-1961  QX:[facility], [facility]  Rolm Bookbinder, MD   REFERRING PHYSICIAN: Rolm Bookbinder, MD  DIAGNOSIS:    ICD-10-CM   1. Malignant neoplasm of upper-outer quadrant of right breast in female, estrogen receptor positive ([place]) C50.411    Z17.0    Cancer Staging Malignant neoplasm of upper-outer quadrant of right breast in female, estrogen receptor positive ([place]) Staging form: Breast, AJCC 8th Edition - Clinical stage from 02/28/2019: Stage IA (cT1b, cN0, cM0, G2, ER+, PR+, HER2-) - Signed by Nicholas Lose, MD on 02/28/2019   CHIEF COMPLAINT: Here to discuss management of breast cancer  HISTORY OF PRESENT ILLNESS::Sherry Rivera is a 58 y.o. female who presented with breast abnormality on the following imaging: Right breast asymmetry on mammogram, screening.  She did not palpate anything at that time.  Ultrasound of breast on right side revealed a 15 mm mass at 12:00.   Biopsy showed invasive ductal carcinoma.  ER status: Positive; PR status positive, Her2 status negative; Grade grade 2.  She has a significant family history and is going to undergo genetic counseling.  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 Wilburn Cornelia 19 years ago and followed by adjuvant radiation Dr. Sondra Come.  She reports no recurrence of her adenoid cystic carcinoma.  She has never given birth to children.  She reports she is unemployed.  She is here with her significant other.  She reports that she is not still having periods.  She has not used hormone replacement.  Review of systems is positive for wearing glasses, pain in her eyes, ringing in her ears, dentures, joint pain, arthritis  PREVIOUS RADIATION THERAPY: Yes as above  PAST MEDICAL HISTORY:  has a past medical history of  Medical history non-contributory.    PAST SURGICAL HISTORY: Past Surgical History:  Procedure Laterality Date  . COLONOSCOPY N/A 02/10/2016   Procedure: COLONOSCOPY;  Surgeon: Aviva Signs, MD;  Location: [facility];  Service: Gastroenterology;  Laterality: N/A;  . laser surgery on cervix    . TONSILLECTOMY    . tumor on tongue removed  2000    FAMILY HISTORY: family history includes Bladder Cancer in her father; Breast cancer in her cousin, mother, and paternal aunt; Colon cancer in her paternal grandmother and another family member; Prostate cancer in her maternal grandfather.  SOCIAL HISTORY:  reports that she quit smoking 10 days ago. Her smoking use included cigarettes. She has a 36.00 pack-year smoking history. She has never used smokeless tobacco. She reports previous alcohol use. She reports that she does not use drugs.  ALLERGIES: Patient has no known allergies.  MEDICATIONS:  Current Outpatient Medications  Medication Sig Dispense Refill  . acetaminophen (TYLENOL) 500 MG tablet Take 1,000 mg by mouth every 6 (six) hours as needed for headache.    . calcium carbonate (OSCAL) 1500 (600 Ca) MG TABS tablet Take 600 mg of elemental calcium by mouth daily.    Marland Kitchen glucosamine-chondroitin 500-400 MG tablet Take 1 tablet by mouth 3 (three) times daily.    . nicotine polacrilex (COMMIT) 2 MG lozenge Take 2 mg by mouth as needed for smoking cessation.    [name] 3-6-9 Fatty Acids (OMEGA 3-6-9 PO) Take 1 capsule by mouth daily.    . pseudoephedrine (SUDAFED) 60 MG tablet Take 60  mg by mouth every 4 (four) hours as needed for congestion.    . TURMERIC PO Take 1 tablet by mouth daily.     No current facility-administered medications for this encounter.     REVIEW OF SYSTEMS: A 10+ POINT REVIEW OF SYSTEMS WAS OBTAINED including neurology, dermatology, psychiatry, cardiac, respiratory, lymph, extremities, GI, GU, Musculoskeletal, constitutional, breasts, reproductive, HEENT.  All pertinent  positives are noted in the HPI.  All others are negative.   PHYSICAL EXAM:  Vitals with BMI 02/28/2019  Height 5' 5"   Weight 168 lbs 13 oz  BMI 61.44  Systolic 315  Diastolic 85  Pulse 90  Respirations 18   General: Alert and oriented, in no acute distress HEENT: Head is normocephalic. Extraocular movements are intact. Oropharynx is clear (I did not palpate, but no visualized lesions in the tongue or oral cavity).  She has a scar on her chin. Neck: Neck is supple, no palpable cervical or supraclavicular lymphadenopathy. Heart: Regular in rate and rhythm with no murmurs, rubs, or gallops. Chest: Clear to auscultation bilaterally, with no rhonchi, wheezes, or rales. Abdomen: Soft, nontender, nondistended, with no rigidity or guarding. Extremities: No cyanosis or edema. Lymphatics: see Neck Exam Skin: No concerning lesions. Musculoskeletal: symmetric strength and muscle tone throughout. Neurologic: Cranial nerves II through XII are grossly intact. No obvious focalities. Speech is fluent. Coordination is intact. Psychiatric: Judgment and insight are intact. Affect is appropriate. Breasts: No palpable masses appreciated in her breasts or axillae  ECOG =0  0 - Asymptomatic (Fully active, able to carry on all predisease activities without restriction)  1 - Symptomatic but completely ambulatory (Restricted in physically strenuous activity but ambulatory and able to carry out work of a light or sedentary nature. For example, light housework, office work)  2 - Symptomatic, <50% in bed during the day (Ambulatory and capable of all self care but unable to carry out any work activities. Up and about more than 50% of waking hours)  3 - Symptomatic, >50% in bed, but not bedbound (Capable of only limited self-care, confined to bed or chair 50% or more of waking hours)  4 - Bedbound (Completely disabled. Cannot carry on any self-care. Totally confined to bed or chair)  5 - Death   Eustace Pen MM,  Creech RH, Tormey DC, et al. [phone]). "Toxicity and response criteria of the [facility]". Equality Oncol. 5 (6): 649-55   LABORATORY DATA:  Lab Results  Component Value Date   WBC 6.5 02/28/2019   HGB 13.8 02/28/2019   HCT 42.8 02/28/2019   MCV 94.9 02/28/2019   PLT 235 02/28/2019   CMP     Component Value Date/Time   NA 144 02/28/2019 0818   K 4.2 02/28/2019 0818   CL 109 02/28/2019 0818   CO2 25 02/28/2019 0818   GLUCOSE 111 (H) 02/28/2019 0818   BUN 12 02/28/2019 0818   CREATININE 0.74 02/28/2019 0818   CALCIUM 8.8 (L) 02/28/2019 0818   PROT 6.8 02/28/2019 0818   ALBUMIN 3.9 02/28/2019 0818   AST 11 (L) 02/28/2019 0818   ALT 8 02/28/2019 0818   ALKPHOS 54 02/28/2019 0818   BILITOT 0.6 02/28/2019 0818   GFRNONAA >60 02/28/2019 0818   GFRAA >60 02/28/2019 0818         RADIOGRAPHY: As above    IMPRESSION/PLAN: This is a very nice woman with stage I right breast cancer  She has been discussed at our multidisciplinary tumor board.  The consensus  is that she would be a good candidate for breast conservation. I talked to her about the option of a mastectomy and informed her that her expected overall survival would be equivalent between mastectomy and breast conservation, based upon randomized controlled data. She is enthusiastic about breast conservation.  It was a pleasure meeting the patient today. We discussed the risks, benefits, and side effects of radiotherapy. I recommend radiotherapy to the right breast to reduce her risk of locoregional recurrence by 2/3.  We discussed that radiation would take approximately 4 weeks to complete and that I would give the patient a few weeks to heal following surgery before starting treatment planning.  If chemotherapy were to be given, this would precede radiotherapy. We spoke about acute effects including skin irritation and fatigue as well as much less common late effects including internal organ injury or  irritation. We spoke about the latest technology that is used to minimize the risk of late effects for patients undergoing radiotherapy to the breast or chest wall. No guarantees of treatment were given. The patient is enthusiastic about proceeding with treatment. I look forward to participating in the patient's care.  I will await her referral back to me for postoperative follow-up and eventual CT simulation/treatment planning.  __________________________________________   Eppie Gibson, MD

## 2019-02-28 NOTE — Therapy (Signed)
[place], [place], [address] Phone: [phone]   Fax:  [phone]  Physical Therapy Evaluation  Patient Details  Name: Sherry Rivera MRN: [id number] Date of Birth: 1961-09-21 Referring Provider (PT): Dr. Rolm Bookbinder   Encounter Date: 02/28/2019  PT End of Session - 02/28/19 1118    Visit Number  1    Number of Visits  2    Date for PT Re-Evaluation  04/25/19    PT Start Time  0924    PT Stop Time  0947    PT Time Calculation (min)  23 min    Activity Tolerance  Patient tolerated treatment well    Behavior During Therapy  [facility] for tasks assessed/performed       Past Medical History:  Diagnosis Date  . Medical history non-contributory     Past Surgical History:  Procedure Laterality Date  . COLONOSCOPY N/A 02/10/2016   Procedure: COLONOSCOPY;  Surgeon: Aviva Signs, MD;  [facility];  Service: Gastroenterology;  Laterality: N/A;  . laser surgery on cervix    . TONSILLECTOMY    . tumor on tongue removed  2000    There were no vitals filed for this visit.   Subjective Assessment - 02/28/19 1102    Subjective  Patient reports she is here today to be seen by her medical team for her newly diagnosed right breast cancer.    Patient is accompained by:  Family member    Pertinent History  Patient was diagnosed on 01/29/2019 with right grade II invasive ductal carcinoma breast cancer. It measures 7 mm and is located in the upper outer quadrant. It is ER/PR positive and HEr2 negative with a Ki67 of 5%.    Patient Stated Goals  Reduce lymphedema risk and learn post op shoulder ROM HEP    Currently in Pain?  No/denies         [facility] PT Assessment - 02/28/19 0001      Assessment   Medical Diagnosis  Right breast cancer    Referring Provider (PT)  Dr. Rolm Bookbinder    Onset Date/Surgical Date  01/29/19    Hand Dominance  Right    Prior Therapy  none      Precautions    Precautions  Other (comment)    Precaution Comments  Active cancer      Restrictions   Weight Bearing Restrictions  No      Balance Screen   Has the patient fallen in the past 6 months  No    Has the patient had a decrease in activity level because of a fear of falling?   No    Is the patient reluctant to leave their home because of a fear of falling?   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      Cognition   Overall Cognitive Status  Within Functional Limits for tasks assessed      Posture/Postural Control   Posture/Postural Control  Postural limitations    Postural Limitations  Rounded Shoulders;Forward head      ROM / Strength   AROM / PROM /  Strength  AROM;Strength      AROM   AROM Assessment Site  Shoulder;Cervical    Right/Left Shoulder  Right;Left    Right Shoulder Extension  52 Degrees    Right Shoulder Flexion  162 Degrees    Right Shoulder ABduction  172 Degrees    Right Shoulder Internal Rotation  80 Degrees    Right Shoulder External Rotation  74 Degrees    Left Shoulder Extension  50 Degrees    Left Shoulder Flexion  142 Degrees    Left Shoulder ABduction  170 Degrees    Left Shoulder Internal Rotation  75 Degrees    Left Shoulder External Rotation  78 Degrees    Cervical Flexion  WNL    Cervical Extension  WNL    Cervical - Right Side Bend  25% limited    Cervical - Left Side Bend  25% limited    Cervical - Right Rotation  25% limited    Cervical - Left Rotation  25% limited      Strength   Overall Strength  Within functional limits for tasks performed        LYMPHEDEMA/ONCOLOGY QUESTIONNAIRE - 02/28/19 1107      Type   Cancer Type  Right breast cancer      Lymphedema Assessments    Lymphedema Assessments  Upper extremities      Right Upper Extremity Lymphedema   10 cm Proximal to Olecranon Process  27.7 cm    Olecranon Process  25 cm    10 cm Proximal to Ulnar Styloid Process  22.7 cm    Just Proximal to Ulnar Styloid Process  15.7 cm    Across Hand at Thumb Web Space  19.7 cm    At Base of 2nd Digit  6.5 cm      Left Upper Extremity Lymphedema   10 cm Proximal to Olecranon Process  29 cm    Olecranon Process  25 cm    10 cm Proximal to Ulnar Styloid Process  22.4 cm    Just Proximal to Ulnar Styloid Process  16.1 cm    Across Hand at Thumb Web Space  19.3 cm    At Base of 2nd Digit  6.4 cm          Quick Dash - 02/28/19 0001    Open a tight or new jar  No difficulty    Do heavy household chores (wash walls, wash floors)  No difficulty    Carry a shopping bag or briefcase  No difficulty    Wash your back  No difficulty    Use a knife to cut food  No difficulty    Recreational activities in which you take some force or impact through your arm, shoulder, or hand (golf, hammering, tennis)  No difficulty    During the past week, to what extent has your arm, shoulder or hand problem interfered with your normal social activities with family, friends, neighbors, or groups?  Not at all    During the past week, to what extent has your arm, shoulder or hand problem limited your work or other regular daily activities  Not at all    Arm, shoulder, or hand pain.  None    Tingling (pins and needles) in your arm, shoulder, or hand  None    Difficulty Sleeping  No difficulty    DASH Score  0 %        Objective measurements completed on examination: See above findings.        Patient was instructed today in a home exercise program today for post op shoulder range of motion. These included active assist shoulder flexion in sitting, scapular retraction, wall walking with shoulder abduction, and hands behind head external rotation.  She was encouraged to do these twice a  day, holding 3 seconds and repeating 5 times when permitted by her physician.           PT Education - 02/28/19 1108    Education Details  Lymphedema risk reduction and post op shoulder ROM HEP    Person(s) Educated  Patient;Spouse    Methods  Demonstration;Explanation;Handout    Comprehension  Returned demonstration;Verbalized understanding          PT Long Term Goals - 02/28/19 1122      PT LONG TERM GOAL #1   Title  Patient will demonstrate she has regained full shoulder ROM and function post operatively compared to baseline assessment.    Time  8    Period  Weeks    Status  New      Breast Clinic Goals - 02/28/19 1122      Patient will be able to verbalize understanding of pertinent lymphedema risk reduction practices relevant to her diagnosis specifically related to skin care.   Time  1    Period  Days    Status  Achieved      Patient will be able to return demonstrate and/or verbalize understanding of the post-op home exercise program related to regaining shoulder range of motion.   Time  1    Period  Days    Status  Achieved      Patient will be able to verbalize understanding of the importance of attending the postoperative After Breast Cancer Class for further lymphedema risk reduction education and therapeutic exercise.   Time  1    Period  Days    Status  Achieved            Plan - 02/28/19 1118    Clinical Impression Statement  Patient was diagnosed on 01/29/2019 with right grade II invasive ductal carcinoma breast cancer. It measures 7 mm and is located in the upper outer quadrant. It is ER/PR positive and HER2 negative with a Ki67 of 5%. Her multidisciplinary medical team met prior to her assessments to determine a recommended treatment plan. She is planning to have a right lumpectomy and sentinel node biopsy followed by Oncotype testing, radiation, and anti-estrogen therapy. She will benefit from a post op PT reassessment to determine needs.     Stability/Clinical Decision Making  Stable/Uncomplicated    Clinical Decision Making  Low    Rehab Potential  Excellent    PT Frequency  --   Eval and 1 f/u visit   PT Treatment/Interventions  ADLs/Self Care Home Management;Patient/family education;Therapeutic exercise    PT Next Visit Plan  Will reassess 3-4 weeks post op to determine needs    PT Home Exercise Plan  Post op shoulder ROM HEP    Consulted and Agree with Plan of Care  Patient;Family member/caregiver    Family Member Consulted  Husband       Patient will benefit from skilled therapeutic intervention in order to improve the following deficits and impairments:  Decreased range of motion, Impaired UE functional use, Pain, Decreased knowledge of precautions, Postural dysfunction  Visit Diagnosis: Malignant neoplasm of upper-outer quadrant of right breast in female, estrogen receptor positive (HCC) - Plan: PT plan of care cert/re-cert  Abnormal posture - Plan:   PT plan of care cert/re-cert   Patient will follow up at outpatient cancer rehab 3-4 weeks following surgery.  If the patient requires physical therapy at that time, a specific plan will be dictated and sent to the referring physician for approval. The patient was educated today on appropriate basic range of motion exercises to begin post operatively and the importance of attending the After Breast Cancer class following surgery.  Patient was educated today on lymphedema risk reduction practices as it pertains to recommendations that will benefit the patient immediately following surgery.  She verbalized good understanding.      Problem List Patient Active Problem List   Diagnosis Date Noted  . Malignant neoplasm of upper-outer quadrant of right breast in female, estrogen receptor positive (HCC) 02/26/2019  . FOOT SPRAIN 06/11/2009   Marti Cooper Smith, PT 02/28/19 11:28 AM  [facility] [address] [place], [place], [address] Phone: [phone]   Fax:  [phone]  Name: Sherry Rivera MRN: [id number] Date of Birth: 12/20/1960  

## 2019-02-28 NOTE — Progress Notes (Signed)
REFERRING PROVIDER: Nicholas Lose, MD [place], [place] [id number]  PRIMARY PROVIDER:  [facility]  PRIMARY REASON FOR VISIT:  1. Malignant neoplasm of upper-outer quadrant of right breast in female, estrogen receptor positive ([place])   2. Family history of breast cancer   3. Family history of prostate cancer   4. Family history of colon cancer     HISTORY OF PRESENT ILLNESS:   [name], a 58 y.o. female, was seen for a [facility] cancer genetics consultation during breast multidisciplinary clinic due to her recent diagnosis of breast cancer and family history of cancer. [name] presents to clinic today to discuss the possibility of a hereditary predisposition to cancer, genetic testing, and to further clarify her future cancer risks, as well as potential cancer risks for family members.   In 2020, at the age of 25, [name] was diagnosed with invasive ductal carcinoma of the right breast, ER+, PR+, Her2-. Her current treatment plan includes surgery, Oncotype DX, adjuvant radiation therapy and adjuvant antiestrogen therapy.   [name] also has a history of an adenoid cystic carinoma of the tongue in 2000.   CANCER HISTORY:    Malignant neoplasm of upper-outer quadrant of right breast in female, estrogen receptor positive ([place])   02/19/2019 Initial Diagnosis    Screening detected right breast asymmetry by ultrasound measured 7 mm, biopsy revealed grade 2 IDC ER 95%, PR 40%, Ki-67 5%, HER-2 1+ negative, T1BN0 stage Ia clinical stage    02/28/2019 Cancer Staging    Staging form: Breast, AJCC 8th Edition - Clinical stage from 02/28/2019: Stage IA (cT1b, cN0, cM0, G2, ER+, PR+, HER2-) - Signed by Nicholas Lose, MD on 02/28/2019     RISK FACTORS:  Menarche was at age 61.  First live birth at age no children.  OCP use for approximately 10 years.  Menopausal status: postmenopausal.  Colonoscopy: yes; in 2017, polyps. Mammogram within the  last year: yes.  Past Medical History:  Diagnosis Date  . Family history of breast cancer   . Family history of colon cancer   . Family history of prostate cancer   . Medical history non-contributory     Past Surgical History:  Procedure Laterality Date  . COLONOSCOPY N/A 02/10/2016   Procedure: COLONOSCOPY;  Surgeon: Aviva Signs, MD;  [facility];  Service: Gastroenterology;  Laterality: N/A;  . laser surgery on cervix    . TONSILLECTOMY    . tumor on tongue removed  2000    Social History   Socioeconomic History  . Marital status: Married    Spouse name: Not on file  . Number of children: Not on file  . Years of education: Not on file  . Highest education level: Not on file  Occupational History  . Not on file  Social Needs  . Financial resource strain: Not on file  . Food insecurity:    Worry: Not on file    Inability: Not on file  . Transportation needs:    Medical: Not on file    Non-medical: Not on file  Tobacco Use  . Smoking status: Former Smoker    Packs/day: 1.00    Years: 36.00    Pack years: 36.00    Types: Cigarettes    Last attempt to quit: 02/18/2019    Years since quitting: 0.0  . Smokeless tobacco: Never Used  Substance and Sexual Activity  . Alcohol use: Not Currently    Comment: Rarely  .  Drug use: No  . Sexual activity: Not on file  Lifestyle  . Physical activity:    Days per week: Not on file    Minutes per session: Not on file  . Stress: Not on file  Relationships  . Social connections:    Talks on phone: Not on file    Gets together: Not on file    Attends religious service: Not on file    Active member of club or organization: Not on file    Attends meetings of clubs or organizations: Not on file    Relationship status: Not on file  Other Topics Concern  . Not on file  Social History Narrative  . Not on file     FAMILY HISTORY:  We obtained a detailed, 4-generation family history.  Significant diagnoses are  listed below: Family History  Problem Relation Age of Onset  . Colon cancer Other   . Breast cancer Mother   . Bladder Cancer Father   . Prostate cancer Maternal Grandfather   . Colon cancer Paternal Grandmother   . Breast cancer Paternal Aunt   . Breast cancer Cousin    [name] does not have children. She has one sister, 85, who has a son and a daughter. No history of cancer.  [name] mother had breast cancer diagnosed at 9 and passed away at 58. [name] has 2 maternal aunts, 1 maternal uncle, no cancers. One of her aunts has a daughter who was diagnosed with breast cancer at 60, is currently 104. No other cancers in maternal cousins. Her maternal grandfather had prostate cancer at 39, died at 9. Maternal grandmother died at 22.  [name] father had bladder cancer at 53, prostate cancer at 47, and is living at 23. [name] has 2 paternal aunts, 1 paternal uncle. One of her aunts had breast cancer at 48, and is living at 11. No cancers in paternal cousins. Her paternal grandmother had colon cancer at 53 and died at 42. Paternal grandfather did not have cancer she is aware of.   [name] is unaware of previous family history of genetic testing for hereditary cancer risks. Patient's maternal ancestors are of Caucasian descent, and paternal ancestors are of Caucasian descent. There is no reported Ashkenazi Jewish ancestry. There is no known consanguinity.  GENETIC COUNSELING ASSESSMENT: [name] is a 58 y.o. female with a personal and family history of cancer which is somewhat suggestive of a Hereditary Breast and Ovarian Cancer and predisposition to cancer. We, therefore, discussed and recommended the following at today's visit.   DISCUSSION: We discussed that 5-10% of breast cancer cases are hereditary, with most being associated with the BRCA1/2 genes. There are other genes that can be associated with hereditary breast cancer syndromes.  These include PALB2, ATM, CHEK2, and  others.  There are other genes associated with other cancers that can be analyzed with genetic testing. We reviewed the characteristics, features and inheritance patterns of hereditary cancer syndromes. We also discussed genetic testing, including the appropriate family members to test, the process of testing, insurance coverage and turn-around-time for results. We discussed the implications of a negative, positive and/or variant of uncertain significant result. In order to get genetic test results in a timely manner so that Ms. Debenedetto can use these genetic test results for surgical decisions, we recommended Ms. Eulas Post pursue genetic testing for the [facility] Breast Cancer STAT Panel. Once complete, we recommend Ms. Legaspi pursue reflex genetic testing to the Common Hereditary  Cancers gene panel.   The STAT Breast cancer panel offered by [facility] includes sequencing and rearrangement analysis for the following 9 genes:  ATM, BRCA1, BRCA2, CDH1, CHEK2, PALB2, PTEN, STK11 and TP53.    The Common Hereditary Cancers Panel offered by [facility] includes sequencing and/or deletion duplication testing of the following 47 genes: APC, ATM, AXIN2, BARD1, BMPR1A, BRCA1, BRCA2, BRIP1, CDH1, CDKN2A (p14ARF), CDKN2A (p16INK4a), CKD4, CHEK2, CTNNA1, DICER1, EPCAM (Deletion/duplication testing only), GREM1 (promoter region deletion/duplication testing only), KIT, MEN1, MLH1, MSH2, MSH3, MSH6, MUTYH, NBN, NF1, NHTL1, PALB2, PDGFRA, PMS2, POLD1, POLE, PTEN, RAD50, RAD51C, RAD51D, SDHB, SDHC, SDHD, SMAD4, SMARCA4. STK11, TP53, TSC1, TSC2, and VHL.  The following genes were evaluated for sequence changes only: SDHA and HOXB13 c.251G>A variant only.  Based on Ms. Mcglasson's personal and family history of cancer, she meets medical criteria for genetic testing. Despite that she meets criteria, she may still have an out of pocket cost.   PLAN: After considering the risks, benefits, and limitations, Ms. Blasko provided informed consent to  pursue genetic testing and the blood sample was sent to [facility] for analysis of the Breast Cancer STAT Panel + Common Hereditary Cancers Panel. Initial results should be available within approximately 1 weeks' time, at which point they will be disclosed by telephone to Ms. Garin, as will any additional recommendations warranted by these results. Ms. Kugel will receive a summary of her genetic counseling visit and a copy of her results once available. This information will also be available in Epic.   Lastly, we encouraged Ms. Zingale to remain in contact with cancer genetics annually so that we can continuously update the family history and inform her of any changes in cancer genetics and testing that may be of benefit for this family.   Ms. Tamayo questions were answered to her satisfaction today. Our contact information was provided should additional questions or concerns arise. Thank you for the referral and allowing [place] to share in the care of your patient.   Faith Rogue, MS Genetic Counselor [email or web address] Phone: [phone]  The patient was seen for a total of 15 minutes in face-to-face genetic counseling. She was accompanied by her husband Ronalee Belts. Drs. Magrinat, Lindi Adie and/or Burr Medico were available for discussion regarding this case.

## 2019-03-01 ENCOUNTER — Telehealth: Payer: Self-pay | Admitting: Hematology and Oncology

## 2019-03-01 NOTE — Telephone Encounter (Signed)
Scheduled appt per 3/12 sch message - pt aware of appt date and time   

## 2019-03-06 ENCOUNTER — Telehealth: Payer: Self-pay | Admitting: *Deleted

## 2019-03-06 NOTE — Telephone Encounter (Signed)
Spoke to pt concerning [place] from 3.11.20. Denies questions or concerns regarding dx or treatment care plan Encourage pt to call with with needs. Received verbal understanding.

## 2019-03-07 ENCOUNTER — Encounter: Payer: Self-pay | Admitting: Licensed Clinical Social Worker

## 2019-03-07 ENCOUNTER — Encounter (HOSPITAL_BASED_OUTPATIENT_CLINIC_OR_DEPARTMENT_OTHER): Payer: Self-pay | Admitting: *Deleted

## 2019-03-07 ENCOUNTER — Other Ambulatory Visit: Payer: Self-pay

## 2019-03-07 ENCOUNTER — Ambulatory Visit: Payer: Self-pay | Admitting: Licensed Clinical Social Worker

## 2019-03-07 ENCOUNTER — Telehealth: Payer: Self-pay | Admitting: Licensed Clinical Social Worker

## 2019-03-07 DIAGNOSIS — Z1379 Encounter for other screening for genetic and chromosomal anomalies: Secondary | ICD-10-CM | POA: Insufficient documentation

## 2019-03-07 NOTE — Telephone Encounter (Signed)
Revealed negative genetic testing.  Revealed that a VUS in ATM was identified. This normal result is reassuring and indicates that it is unlikely Sherry Rivera's cancer is due to a hereditary cause.  It is unlikely that there is an increased risk of another cancer due to a mutation in one of these genes.  However, genetic testing is not perfect, and cannot definitively rule out a hereditary cause.  It will be important for her to keep in contact with genetics to learn if any additional testing may be needed in the future.

## 2019-03-07 NOTE — Progress Notes (Addendum)
HPI:  [name] was previously seen in the [place] clinic due to her recent diagnosis of breast cancer, family history of cancer and concerns regarding a hereditary predisposition to cancer. Please refer to our prior cancer genetics clinic note for more information regarding our discussion, assessment and recommendations, at the time. [name] recent genetic test results were disclosed to her, as were recommendations warranted by these results. These results and recommendations are discussed in more detail below.  In 2020, at the age of 58, [name] was diagnosed with invasive ductal carcinoma of the right breast, ER+, PR+, Her2-. Her current treatment plan includes surgery, Oncotype DX, adjuvant radiation therapy and adjuvant antiestrogen therapy.   [name] also has a history of an adenoid cystic carinoma of the tongue in 2000.    CANCER HISTORY:    Malignant neoplasm of upper-outer quadrant of right breast in female, estrogen receptor positive ([place])   02/19/2019 Initial Diagnosis    Screening detected right breast asymmetry by ultrasound measured 7 mm, biopsy revealed grade 2 IDC ER 95%, PR 40%, Ki-67 5%, HER-2 1+ negative, T1BN0 stage Ia clinical stage    02/28/2019 Cancer Staging    Staging form: Breast, AJCC 8th Edition - Clinical stage from 02/28/2019: Stage IA (cT1b, cN0, cM0, G2, ER+, PR+, HER2-) - Signed by Nicholas Lose, MD on 02/28/2019    03/07/2019 Genetic Testing    VUS in ATM called c.7919C>T was identified on the [facility] Breast Cancer STAT Panel + Common Hereditary Cancers Pane. The STAT Breast cancer panel offered by [facility] includes sequencing and rearrangement analysis for the following 9 genes:  ATM, BRCA1, BRCA2, CDH1, CHEK2, PALB2, PTEN, STK11 and TP53.  The Common Hereditary Cancers Panel offered by [facility] includes sequencing and/or deletion duplication testing of the following 47 genes: APC, ATM, AXIN2, BARD1, BMPR1A, BRCA1, BRCA2, BRIP1, CDH1,  CDKN2A (p14ARF), CDKN2A (p16INK4a), CKD4, CHEK2, CTNNA1, DICER1, EPCAM (Deletion/duplication testing only), GREM1 (promoter region deletion/duplication testing only), KIT, MEN1, MLH1, MSH2, MSH3, MSH6, MUTYH, NBN, NF1, NHTL1, PALB2, PDGFRA, PMS2, POLD1, POLE, PTEN, RAD50, RAD51C, RAD51D, SDHB, SDHC, SDHD, SMAD4, SMARCA4. STK11, TP53, TSC1, TSC2, and VHL.  The following genes were evaluated for sequence changes only: SDHA and HOXB13 c.251G>A variant only. The report date is 03/07/2019.     FAMILY HISTORY:  We obtained a detailed, 4-generation family history.  Significant diagnoses are listed below: Family History  Problem Relation Age of Onset  . Colon cancer Other   . Breast cancer Mother   . Bladder Cancer Father   . Prostate cancer Maternal Grandfather   . Colon cancer Paternal Grandmother   . Breast cancer Paternal Aunt   . Breast cancer Cousin    [name] does not have children. She has one sister, 35, who has a son and a daughter. No history of cancer.  [name] mother had breast cancer diagnosed at 56 and passed away at 56. [name] has 2 maternal aunts, 1 maternal uncle, no cancers. One of her aunts has a daughter who was diagnosed with breast cancer at 77, is currently 58. No other cancers in maternal cousins. Her maternal grandfather had prostate cancer at 45, died at 68. Maternal grandmother died at 38.  [name] father had bladder cancer at 56, prostate cancer at 56, and is living at 29. [name] has 2 paternal aunts, 1 paternal uncle. One of her aunts had breast cancer at 56, and is living at 56 No cancers in paternal cousins. Her  paternal grandmother had colon cancer at 56 and died at 56 Paternal grandfather did not have cancer she is aware of.   [name] is unaware of previous family history of genetic testing for hereditary cancer risks. Patient's maternal ancestors are of Caucasian descent, and paternal ancestors are of Caucasian descent. There is no reported  Ashkenazi Jewish ancestry. There is no known consanguinity.   GENETIC TEST RESULTS: Genetic testing reported out on 03/07/2019 through the [facility] Breast Cancer STAT Panel + Common Hereditary cancer panel found no pathogenic mutations.   The STAT Breast cancer panel offered by [facility] includes sequencing and rearrangement analysis for the following 9 genes:  ATM, BRCA1, BRCA2, CDH1, CHEK2, PALB2, PTEN, STK11 and TP53.    The Common Hereditary Cancers Panel offered by [facility] includes sequencing and/or deletion duplication testing of the following 47 genes: APC, ATM, AXIN2, BARD1, BMPR1A, BRCA1, BRCA2, BRIP1, CDH1, CDKN2A (p14ARF), CDKN2A (p16INK4a), CKD4, CHEK2, CTNNA1, DICER1, EPCAM (Deletion/duplication testing only), GREM1 (promoter region deletion/duplication testing only), KIT, MEN1, MLH1, MSH2, MSH3, MSH6, MUTYH, NBN, NF1, NHTL1, PALB2, PDGFRA, PMS2, POLD1, POLE, PTEN, RAD50, RAD51C, RAD51D, SDHB, SDHC, SDHD, SMAD4, SMARCA4. STK11, TP53, TSC1, TSC2, and VHL.  The following genes were evaluated for sequence changes only: SDHA and HOXB13 c.251G>A variant only.  The test report has been scanned into EPIC and is located under the Molecular Pathology section of the Results Review tab.      We discussed with [name] that because current genetic testing is not perfect, it is possible there may be a gene mutation in one of these genes that current testing cannot detect, but that chance is small.  We also discussed, that there could be another gene that has not yet been discovered, or that we have not yet tested, that is responsible for the cancer diagnoses in the family. It is also possible there is a hereditary cause for the cancer in the family that [name] did not inherit and therefore was not identified in her testing.  Therefore, it is important to remain in touch with cancer genetics in the future so that we can continue to offer Sherry Rivera the most up to date genetic testing.   Genetic  testing did identify a variant of uncertain significance (VUS) in the ATM gene called c.7919C>T.  At this time, it is unknown if this variant is associated with increased cancer risk or if this is a normal finding, but most variants such as this get reclassified to being inconsequential. It should not be used to make medical management decisions. With time, we suspect the lab will determine the significance of this variant, if any. If we do learn more about it, we will try to contact Ms. Lecrone to discuss it further. However, it is important to stay in touch with [place] periodically and keep the address and phone number up to date.   ADDITIONAL GENETIC TESTING: We discussed with Ms. Chrobak that her genetic testing was fairly extensive.  If there are genes identified to increase cancer risk that can be analyzed in the future, we would be happy to discuss and coordinate this testing at that time.    CANCER SCREENING RECOMMENDATIONS: Ms. Crounse test result is considered negative (normal).  This means that we have not identified a hereditary cause for her  personal and family history of cancer at this time. Most cancers happen by chance and this negative test suggests that her cancer may fall into this category.    While reassuring, this  does not definitively rule out a hereditary predisposition to cancer. It is still possible that there could be genetic mutations that are undetectable by current technology. There could be genetic mutations in genes that have not been tested or identified to increase cancer risk.  Therefore, it is recommended she continue to follow the cancer management and screening guidelines provided by her oncology and primary healthcare provider.   An individual's cancer risk and medical management are not determined by genetic test results alone. Overall cancer risk assessment incorporates additional factors, including personal medical history, family history, and any available genetic  information that may result in a personalized plan for cancer prevention and surveillance  RECOMMENDATIONS FOR FAMILY MEMBERS:  Women in this family might be at some increased risk of developing cancer, over the general population risk, simply due to the family history of cancer.  We recommended women in this family have a yearly mammogram beginning at age 12, or 77 years younger than the earliest onset of cancer, an annual clinical breast exam, and perform monthly breast self-exams. Women in this family should also have a gynecological exam as recommended by their primary provider. All family members should have a colonoscopy by age 21.   It is also possible there is a hereditary cause for the cancer in Ms. Pawloski's family that she did not inherit and therefore was not identified in her.  Based on Ms. Faith's family history, her maternal relatives could consider having genetic counseling and testing. Ms. Lutes will let us know if we can be of any assistance in coordinating genetic counseling and/or testing for this family member.   FOLLOW-UP: Lastly, we discussed with Ms. Losada that cancer genetics is a rapidly advancing field and it is possible that new genetic tests will be appropriate for her and/or her family members in the future. We encouraged her to remain in contact with cancer genetics on an annual basis so we can update her personal and family histories and let her know of advances in cancer genetics that may benefit this family.   Our contact number was provided. Ms. Witkop questions were answered to her satisfaction, and she knows she is welcome to call us at anytime with additional questions or concerns.   Faith Rogue, MS Genetic Counselor [email or web address] Phone: [phone]

## 2019-03-12 ENCOUNTER — Telehealth: Payer: Self-pay | Admitting: *Deleted

## 2019-03-12 ENCOUNTER — Telehealth: Payer: Self-pay | Admitting: Hematology and Oncology

## 2019-03-12 ENCOUNTER — Encounter: Payer: Self-pay | Admitting: *Deleted

## 2019-03-12 ENCOUNTER — Other Ambulatory Visit: Payer: Self-pay | Admitting: Hematology and Oncology

## 2019-03-12 MED ORDER — ANASTROZOLE 1 MG PO TABS: 1.0000 mg | ORAL_TABLET | Freq: Every day | ORAL | 3 refills | Status: DC

## 2019-03-12 NOTE — Telephone Encounter (Signed)
Returned phone call to pt to educate her regarding anastrazol anti estrogen therapy.  Instructed pt to take every other night for the first week and then increase to every night.  Also instructed pt this medication can cause hot flashes and bone pain, instructed pt to reach out to [place] if she experiences any of these side effects.  Pt verbalizes understanding.

## 2019-03-12 NOTE — Telephone Encounter (Signed)
I received information from Dr. Donne Hazel that surgery is being postponed. Because of this I recommended starting the patient on anastrozole therapy. I sent a prescription to her pharmacy. If she calls back please counsel her regarding anastrozole toxicities and benefits.  Have her call us if she has any trouble taking this medication.

## 2019-03-12 NOTE — Progress Notes (Signed)
[place] Psychosocial Distress Screening Clinical Social Work  Clinical Social Work was referred by distress screening protocol.  The patient scored a 1 on the Psychosocial Distress Thermometer which indicates mild distress. Clinical Social Worker contacted patient at home to assess for distress and other psychosocial needs, and follow up after [facility].  Patient stated she was doing well and had no concerns at this time.  CSW provided education on the support team and support services at [facility].  CSW encouraged patient to call with questions or concerns.    ONCBCN DISTRESS SCREENING 03/12/2019  Screening Type Initial Screening  Distress experienced in past week (1-10) 1    Johnnye Lana, MSW, LCSW, OSW-C Clinical Social Worker [place] [phone]

## 2019-03-13 DIAGNOSIS — C50811 Malignant neoplasm of overlapping sites of right female breast: Secondary | ICD-10-CM | POA: Diagnosis not present

## 2019-03-13 NOTE — Progress Notes (Signed)
Ensure pre surgical drink given to pt with instructions to finish by 0915. Hibiclens soap given to pt with instructions. Pt verbalized understanding.

## 2019-03-15 ENCOUNTER — Other Ambulatory Visit: Payer: Self-pay

## 2019-03-15 ENCOUNTER — Ambulatory Visit (HOSPITAL_BASED_OUTPATIENT_CLINIC_OR_DEPARTMENT_OTHER)
Admission: RE | Admit: 2019-03-15 | Discharge: 2019-03-15 | Disposition: A | Discharge status: Home / Self Care | Payer: 59 | Attending: General Surgery | Admitting: General Surgery

## 2019-03-15 ENCOUNTER — Ambulatory Visit (HOSPITAL_BASED_OUTPATIENT_CLINIC_OR_DEPARTMENT_OTHER): Payer: 59 | Admitting: Anesthesiology

## 2019-03-15 ENCOUNTER — Encounter (HOSPITAL_BASED_OUTPATIENT_CLINIC_OR_DEPARTMENT_OTHER): Payer: Self-pay | Admitting: *Deleted

## 2019-03-15 ENCOUNTER — Encounter (HOSPITAL_BASED_OUTPATIENT_CLINIC_OR_DEPARTMENT_OTHER)
Admission: RE | Disposition: A | Discharge status: Home / Self Care | Payer: Self-pay | Source: Home / Self Care | Attending: General Surgery

## 2019-03-15 ENCOUNTER — Ambulatory Visit (HOSPITAL_COMMUNITY)
Admission: RE | Admit: 2019-03-15 | Discharge: 2019-03-15 | Disposition: A | Discharge status: Home / Self Care | Payer: 59 | Source: Ambulatory Visit | Attending: General Surgery | Admitting: General Surgery

## 2019-03-15 ENCOUNTER — Other Ambulatory Visit (HOSPITAL_COMMUNITY): Payer: 59

## 2019-03-15 DIAGNOSIS — Z170 Estrogen receptor positive status [ER+]: Principal | ICD-10-CM

## 2019-03-15 DIAGNOSIS — C50911 Malignant neoplasm of unspecified site of right female breast: Secondary | ICD-10-CM | POA: Diagnosis not present

## 2019-03-15 DIAGNOSIS — Z85810 Personal history of malignant neoplasm of tongue: Secondary | ICD-10-CM | POA: Diagnosis not present

## 2019-03-15 DIAGNOSIS — G8918 Other acute postprocedural pain: Secondary | ICD-10-CM | POA: Diagnosis not present

## 2019-03-15 DIAGNOSIS — Z87891 Personal history of nicotine dependence: Secondary | ICD-10-CM | POA: Diagnosis not present

## 2019-03-15 DIAGNOSIS — Z803 Family history of malignant neoplasm of breast: Secondary | ICD-10-CM | POA: Diagnosis not present

## 2019-03-15 DIAGNOSIS — C50411 Malignant neoplasm of upper-outer quadrant of right female breast: Secondary | ICD-10-CM | POA: Diagnosis not present

## 2019-03-15 DIAGNOSIS — Z800 Family history of malignant neoplasm of digestive organs: Secondary | ICD-10-CM | POA: Insufficient documentation

## 2019-03-15 DIAGNOSIS — C50211 Malignant neoplasm of upper-inner quadrant of right female breast: Secondary | ICD-10-CM | POA: Diagnosis present

## 2019-03-15 HISTORY — DX: Other seasonal allergic rhinitis: J30.2

## 2019-03-15 HISTORY — PX: BREAST LUMPECTOMY WITH RADIOACTIVE SEED AND SENTINEL LYMPH NODE BIOPSY: SHX6550

## 2019-03-15 HISTORY — DX: Malignant (primary) neoplasm, unspecified: C80.1

## 2019-03-15 SURGERY — BREAST LUMPECTOMY WITH RADIOACTIVE SEED AND SENTINEL LYMPH NODE BIOPSY
Anesthesia: General | Site: Breast | Laterality: Right

## 2019-03-15 MED ORDER — MIDAZOLAM HCL 2 MG/2ML IJ SOLN
INTRAMUSCULAR | Status: AC
  Filled 2019-03-15: qty 2

## 2019-03-15 MED ORDER — PHENYLEPHRINE 40 MCG/ML (10ML) SYRINGE FOR IV PUSH (FOR BLOOD PRESSURE SUPPORT)
PREFILLED_SYRINGE | INTRAVENOUS | Status: AC
  Filled 2019-03-15: qty 10

## 2019-03-15 MED ORDER — MIDAZOLAM HCL 2 MG/2ML IJ SOLN
1.0000 mg | INTRAMUSCULAR | Status: DC | PRN
  Administered 2019-03-15 (×2): 1 mg via INTRAVENOUS

## 2019-03-15 MED ORDER — KETOROLAC TROMETHAMINE 15 MG/ML IJ SOLN
INTRAMUSCULAR | Status: AC
  Filled 2019-03-15: qty 1

## 2019-03-15 MED ORDER — ONDANSETRON HCL 4 MG/2ML IJ SOLN
INTRAMUSCULAR | Status: AC
  Filled 2019-03-15: qty 2

## 2019-03-15 MED ORDER — SCOPOLAMINE 1 MG/3DAYS TD PT72: 1.0000 | MEDICATED_PATCH | Freq: Once | TRANSDERMAL | Status: DC | PRN

## 2019-03-15 MED ORDER — DEXAMETHASONE SODIUM PHOSPHATE 10 MG/ML IJ SOLN
INTRAMUSCULAR | Status: AC
  Filled 2019-03-15: qty 1

## 2019-03-15 MED ORDER — FENTANYL CITRATE (PF) 100 MCG/2ML IJ SOLN
50.0000 ug | INTRAMUSCULAR | Status: AC | PRN
  Administered 2019-03-15 (×3): 50 ug via INTRAVENOUS

## 2019-03-15 MED ORDER — CEFAZOLIN SODIUM-DEXTROSE 2-4 GM/100ML-% IV SOLN
INTRAVENOUS | Status: AC
  Filled 2019-03-15: qty 100

## 2019-03-15 MED ORDER — FENTANYL CITRATE (PF) 100 MCG/2ML IJ SOLN: 25.0000 ug | INTRAMUSCULAR | Status: DC | PRN

## 2019-03-15 MED ORDER — CEFAZOLIN SODIUM-DEXTROSE 2-4 GM/100ML-% IV SOLN
2.0000 g | INTRAVENOUS | Status: AC
  Administered 2019-03-15: 2 g via INTRAVENOUS

## 2019-03-15 MED ORDER — PHENYLEPHRINE HCL 10 MG/ML IJ SOLN
INTRAMUSCULAR | Status: DC | PRN
  Administered 2019-03-15 (×2): 80 ug via INTRAVENOUS

## 2019-03-15 MED ORDER — ONDANSETRON HCL 4 MG/2ML IJ SOLN
INTRAMUSCULAR | Status: DC | PRN
  Administered 2019-03-15: 4 mg via INTRAVENOUS

## 2019-03-15 MED ORDER — TECHNETIUM TC 99M SULFUR COLLOID FILTERED: 1.0000 | Freq: Once | INTRAVENOUS | Status: DC | PRN

## 2019-03-15 MED ORDER — FENTANYL CITRATE (PF) 100 MCG/2ML IJ SOLN
INTRAMUSCULAR | Status: AC
  Filled 2019-03-15: qty 2

## 2019-03-15 MED ORDER — GABAPENTIN 100 MG PO CAPS
ORAL_CAPSULE | ORAL | Status: AC
  Filled 2019-03-15: qty 1

## 2019-03-15 MED ORDER — GABAPENTIN 100 MG PO CAPS
100.0000 mg | ORAL_CAPSULE | ORAL | Status: AC
  Administered 2019-03-15: 100 mg via ORAL

## 2019-03-15 MED ORDER — LIDOCAINE 2% (20 MG/ML) 5 ML SYRINGE
INTRAMUSCULAR | Status: DC | PRN
  Administered 2019-03-15: 50 mg via INTRAVENOUS

## 2019-03-15 MED ORDER — PROPOFOL 10 MG/ML IV BOLUS
INTRAVENOUS | Status: DC | PRN
  Administered 2019-03-15: 200 mg via INTRAVENOUS

## 2019-03-15 MED ORDER — ACETAMINOPHEN 500 MG PO TABS
ORAL_TABLET | ORAL | Status: AC
  Filled 2019-03-15: qty 2

## 2019-03-15 MED ORDER — ENSURE PRE-SURGERY PO LIQD: 296.0000 mL | Freq: Once | ORAL | Status: DC

## 2019-03-15 MED ORDER — ACETAMINOPHEN 500 MG PO TABS
1000.0000 mg | ORAL_TABLET | ORAL | Status: AC
  Administered 2019-03-15: 1000 mg via ORAL

## 2019-03-15 MED ORDER — EPHEDRINE SULFATE 50 MG/ML IJ SOLN
INTRAMUSCULAR | Status: DC | PRN
  Administered 2019-03-15 (×2): 10 mg via INTRAVENOUS

## 2019-03-15 MED ORDER — LIDOCAINE 2% (20 MG/ML) 5 ML SYRINGE
INTRAMUSCULAR | Status: AC
  Filled 2019-03-15: qty 5

## 2019-03-15 MED ORDER — LACTATED RINGERS IV SOLN
INTRAVENOUS | Status: DC
  Administered 2019-03-15: 12:00:00 via INTRAVENOUS

## 2019-03-15 MED ORDER — OXYCODONE HCL 5 MG PO TABS: 5.0000 mg | ORAL_TABLET | Freq: Four times a day (QID) | ORAL | 0 refills | Status: DC | PRN

## 2019-03-15 MED ORDER — BUPIVACAINE HCL (PF) 0.25 % IJ SOLN
INTRAMUSCULAR | Status: DC | PRN
  Administered 2019-03-15: 9 mL

## 2019-03-15 MED ORDER — DEXAMETHASONE SODIUM PHOSPHATE 4 MG/ML IJ SOLN
INTRAMUSCULAR | Status: DC | PRN
  Administered 2019-03-15: 10 mg via INTRAVENOUS

## 2019-03-15 MED ORDER — KETOROLAC TROMETHAMINE 15 MG/ML IJ SOLN
15.0000 mg | INTRAMUSCULAR | Status: AC
  Administered 2019-03-15: 15 mg via INTRAVENOUS

## 2019-03-15 SURGICAL SUPPLY — 61 items
ADH SKN CLS APL DERMABOND .7 (GAUZE/BANDAGES/DRESSINGS) ×1
APL PRP STRL LF DISP 70% ISPRP (MISCELLANEOUS) ×1
APPLIER CLIP 9.375 MED OPEN (MISCELLANEOUS)
APR CLP MED 9.3 20 MLT OPN (MISCELLANEOUS)
BINDER BREAST LRG (GAUZE/BANDAGES/DRESSINGS) IMPLANT
BINDER BREAST MEDIUM (GAUZE/BANDAGES/DRESSINGS) IMPLANT
BINDER BREAST XLRG (GAUZE/BANDAGES/DRESSINGS) IMPLANT
BINDER BREAST XXLRG (GAUZE/BANDAGES/DRESSINGS) IMPLANT
BLADE SURG 15 STRL LF DISP TIS (BLADE) ×1 IMPLANT
BLADE SURG 15 STRL SS (BLADE) ×3
CANISTER SUC SOCK COL 7IN (MISCELLANEOUS) IMPLANT
CANISTER SUCT 1200ML W/VALVE (MISCELLANEOUS) IMPLANT
CHLORAPREP W/TINT 26 (MISCELLANEOUS) ×3 IMPLANT
CLIP APPLIE 9.375 MED OPEN (MISCELLANEOUS) IMPLANT
CLIP VESOCCLUDE SM WIDE 6/CT (CLIP) ×3 IMPLANT
CLOSURE WOUND 1/2 X4 (GAUZE/BANDAGES/DRESSINGS) ×1
COVER BACK TABLE REUSABLE LG (DRAPES) ×3 IMPLANT
COVER MAYO STAND REUSABLE (DRAPES) ×3 IMPLANT
COVER PROBE W GEL 5X96 (DRAPES) ×3 IMPLANT
DECANTER SPIKE VIAL GLASS SM (MISCELLANEOUS) ×2 IMPLANT
DERMABOND ADVANCED (GAUZE/BANDAGES/DRESSINGS) ×2
DERMABOND ADVANCED .7 DNX12 (GAUZE/BANDAGES/DRESSINGS) ×1 IMPLANT
DRAPE LAPAROSCOPIC ABDOMINAL (DRAPES) ×3 IMPLANT
DRAPE UTILITY XL STRL (DRAPES) ×3 IMPLANT
ELECT COATED BLADE 2.86 ST (ELECTRODE) ×3 IMPLANT
ELECT REM PT RETURN 9FT ADLT (ELECTROSURGICAL) ×3
ELECTRODE REM PT RTRN 9FT ADLT (ELECTROSURGICAL) ×1 IMPLANT
GLOVE BIO SURGEON STRL SZ7 (GLOVE) ×8 IMPLANT
GLOVE BIOGEL PI IND STRL 7.5 (GLOVE) ×1 IMPLANT
GLOVE BIOGEL PI INDICATOR 7.5 (GLOVE) ×4
GOWN STRL REUS W/ TWL LRG LVL3 (GOWN DISPOSABLE) ×2 IMPLANT
GOWN STRL REUS W/TWL LRG LVL3 (GOWN DISPOSABLE) ×6
HEMOSTAT ARISTA ABSORB 3G PWDR (HEMOSTASIS) IMPLANT
ILLUMINATOR WAVEGUIDE N/F (MISCELLANEOUS) IMPLANT
KIT MARKER MARGIN INK (KITS) ×3 IMPLANT
LIGHT WAVEGUIDE WIDE FLAT (MISCELLANEOUS) IMPLANT
NDL HYPO 25X1 1.5 SAFETY (NEEDLE) ×1 IMPLANT
NDL SAFETY ECLIPSE 18X1.5 (NEEDLE) IMPLANT
NEEDLE HYPO 18GX1.5 SHARP (NEEDLE)
NEEDLE HYPO 25X1 1.5 SAFETY (NEEDLE) ×3 IMPLANT
NS IRRIG 1000ML POUR BTL (IV SOLUTION) IMPLANT
PACK BASIN DAY SURGERY FS (CUSTOM PROCEDURE TRAY) ×3 IMPLANT
PENCIL BUTTON HOLSTER BLD 10FT (ELECTRODE) ×3 IMPLANT
SLEEVE SCD COMPRESS KNEE MED (MISCELLANEOUS) ×3 IMPLANT
SPONGE LAP 4X18 RFD (DISPOSABLE) ×3 IMPLANT
STRIP CLOSURE SKIN 1/2X4 (GAUZE/BANDAGES/DRESSINGS) ×2 IMPLANT
SUT ETHILON 2 0 FS 18 (SUTURE) IMPLANT
SUT MNCRL AB 4-0 PS2 18 (SUTURE) ×3 IMPLANT
SUT MON AB 5-0 PS2 18 (SUTURE) ×2 IMPLANT
SUT SILK 2 0 SH (SUTURE) IMPLANT
SUT VIC AB 2-0 SH 27 (SUTURE) ×6
SUT VIC AB 2-0 SH 27XBRD (SUTURE) ×1 IMPLANT
SUT VIC AB 3-0 SH 27 (SUTURE) ×3
SUT VIC AB 3-0 SH 27X BRD (SUTURE) ×1 IMPLANT
SUT VIC AB 5-0 PS2 18 (SUTURE) IMPLANT
SYR CONTROL 10ML LL (SYRINGE) ×3 IMPLANT
TOWEL GREEN STERILE FF (TOWEL DISPOSABLE) ×3 IMPLANT
TRAY FAXITRON CT DISP (TRAY / TRAY PROCEDURE) ×3 IMPLANT
TUBE CONNECTING 20'X1/4 (TUBING)
TUBE CONNECTING 20X1/4 (TUBING) IMPLANT
YANKAUER SUCT BULB TIP NO VENT (SUCTIONS) IMPLANT

## 2019-03-15 NOTE — Anesthesia Postprocedure Evaluation (Signed)
Anesthesia Post Note  Patient: LETICIA COLETTA  Procedure(s) Performed: RIGHT BREAST LUMPECTOMY WITH RADIOACTIVE SEED AND RIGHT AXILLARY SENTINEL LYMPH NODE BIOPSY (Right Breast)     Patient location during evaluation: PACU Anesthesia Type: General Level of consciousness: awake Pain management: pain level controlled Vital Signs Assessment: post-procedure vital signs reviewed and stable Respiratory status: spontaneous breathing Cardiovascular status: stable Postop Assessment: no apparent nausea or vomiting Anesthetic complications: no    Last Vitals:  Vitals:   03/15/19 1415 03/15/19 1430  BP: 115/66 104/70  Pulse: 65 65  Resp: 15 18  Temp:  36.6 C  SpO2: 98% 99%    Last Pain:  Vitals:   03/15/19 1430  TempSrc:   PainSc: 0-No pain                 Kazumi Lachney

## 2019-03-15 NOTE — Anesthesia Procedure Notes (Addendum)
Anesthesia Regional Block: Pectoralis block   Pre-Anesthetic Checklist: ,, timeout performed, Correct Patient, Correct Site, Correct Laterality, Correct Procedure, Correct Position, site marked, Risks and benefits discussed,  Surgical consent,  Pre-op evaluation,  At surgeon's request and post-op pain management  Laterality: Right  Prep: chloraprep       Needles:  Injection technique: Single-shot  Needle Type: Echogenic Stimulator Needle          Additional Needles:   Procedures: Doppler guided,,,, ultrasound used (permanent image in chart),,,,  Narrative:  Start time: 03/15/2019 12:15 PM End time: 03/15/2019 12:30 PM Injection made incrementally with aspirations every 5 mL.  Performed by: Personally  Anesthesiologist: Belinda Block, MD

## 2019-03-15 NOTE — Progress Notes (Signed)
Emotional support during breast injections °

## 2019-03-15 NOTE — Addendum Note (Signed)
Addendum  created 03/15/19 1815 by Belinda Block, MD   Child order released for a procedure order, Clinical Note Signed, Intraprocedure Blocks edited, Order Canceled from Note

## 2019-03-15 NOTE — Transfer of Care (Signed)
Immediate Anesthesia Transfer of Care Note  Patient: Sherry Rivera  Procedure(s) Performed: RIGHT BREAST LUMPECTOMY WITH RADIOACTIVE SEED AND RIGHT AXILLARY SENTINEL LYMPH NODE BIOPSY (Right Breast)  Patient Location: PACU  Anesthesia Type:General and Regional  Level of Consciousness: awake and sedated  Airway & Oxygen Therapy: Patient Spontanous Breathing and Patient connected to face mask oxygen  Post-op Assessment: Report given to RN and Post -op Vital signs reviewed and stable  Post vital signs: Reviewed and stable  Last Vitals:  Vitals Value Taken Time  BP 94/61 03/15/2019  1:45 PM  Temp    Pulse 64 03/15/2019  1:46 PM  Resp 19 03/15/2019  1:46 PM  SpO2 100 % 03/15/2019  1:46 PM    Last Pain:  Vitals:   03/15/19 1145  TempSrc: Oral  PainSc: 0-No pain      Patients Stated Pain Goal: 0 (38/18/40 3754)  Complications: No apparent anesthesia complications

## 2019-03-15 NOTE — Interval H&P Note (Signed)
History and Physical Interval Note:  03/15/2019 12:33 PM  Sherry Rivera  has presented today for surgery, with the diagnosis of RIGHT BREAST CANCER.  The various methods of treatment have been discussed with the patient and family. After consideration of risks, benefits and other options for treatment, the patient has consented to  Procedure(s): RIGHT BREAST LUMPECTOMY WITH RADIOACTIVE SEED AND RIGHT AXILLARY SENTINEL LYMPH NODE BIOPSY (Right) as a surgical intervention.  The patient's history has been reviewed, patient examined, no change in status, stable for surgery.  I have reviewed the patient's chart and labs.  Questions were answered to the patient's satisfaction.     Rolm Bookbinder

## 2019-03-15 NOTE — Discharge Instructions (Signed)
Post Anesthesia Home Care Instructions  NO TYLENOL UNTIL AFTER 600PM ON 03/15/2019. NO IBUPROFEN UNTIL AFTER 600PM ON 03/15/2019.  Activity: Get plenty of rest for the remainder of the day. A responsible individual must stay with you for 24 hours following the procedure.  For the next 24 hours, DO NOT: -Drive a car -Paediatric nurse -Drink alcoholic beverages -Take any medication unless instructed by your physician -Make any legal decisions or sign important papers.  Meals: Start with liquid foods such as gelatin or soup. Progress to regular foods as tolerated. Avoid greasy, spicy, heavy foods. If nausea and/or vomiting occur, drink only clear liquids until the nausea and/or vomiting subsides. Call your physician if vomiting continues.  Special Instructions/Symptoms: Your throat may feel dry or sore from the anesthesia or the breathing tube placed in your throat during surgery. If this causes discomfort, gargle with warm salt water. The discomfort should disappear within 24 hours.  If you had a scopolamine patch placed behind your ear for the management of post- operative nausea and/or vomiting:  1. The medication in the patch is effective for 72 hours, after which it should be removed.  Wrap patch in a tissue and discard in the trash. Wash hands thoroughly with soap and water. 2. You may remove the patch earlier than 72 hours if you experience unpleasant side effects which may include dry mouth, dizziness or visual disturbances. 3. Avoid touching the patch. Wash your hands with soap and water after contact with the patch.       [facility] Phone Number [phone]  POST OP INSTRUCTIONS Take 400 mg of ibuprofen every 8 hours or 650 mg tylenol every 6 hours for next 72 hours then as needed. Use ice several times daily also. Always review your discharge instruction sheet given to you by the facility where your surgery was performed.  IF YOU HAVE DISABILITY  OR FAMILY LEAVE FORMS, YOU MUST BRING THEM TO THE OFFICE FOR PROCESSING.  DO NOT GIVE THEM TO YOUR DOCTOR.  1. A prescription for pain medication may be given to you upon discharge.  Take your pain medication as prescribed, if needed.  If narcotic pain medicine is not needed, then you may take acetaminophen (Tylenol), naprosyn (Alleve) or ibuprofen (Advil) as needed. 2. Take your usually prescribed medications unless otherwise directed 3. If you need a refill on your pain medication, please contact your pharmacy.  They will contact our office to request authorization.  Prescriptions will not be filled after 5pm or on week-ends. 4. You should eat very light the first 24 hours after surgery, such as soup, crackers, pudding, etc.  Resume your normal diet the day after surgery. 5. Most patients will experience some swelling and bruising in the breast.  Ice packs and a good support bra will help.  Wear the breast binder provided or a sports bra for 72 hours day and night.  After that wear a sports bra during the day until you return to the office. Swelling and bruising can take several days to resolve.  6. It is common to experience some constipation if taking pain medication after surgery.  Increasing fluid intake and taking a stool softener will usually help or prevent this problem from occurring.  A mild laxative (Milk of Magnesia or Miralax) should be taken according to package directions if there are no bowel movements after 48 hours. 7. Unless discharge instructions indicate otherwise, you may remove your bandages 48 hours after surgery and you may shower  at that time.  You may have steri-strips (small skin tapes) in place directly over the incision.  These strips should be left on the skin for 7-10 days and will come off on their own.  If your surgeon used skin glue on the incision, you may shower in 24 hours.  The glue will flake off over the next 2-3 weeks.  Any sutures or staples will be removed at the  office during your follow-up visit. 8. ACTIVITIES:  You may resume regular daily activities (gradually increasing) beginning the next day.  Wearing a good support bra or sports bra minimizes pain and swelling.  You may have sexual intercourse when it is comfortable. a. You may drive when you no longer are taking prescription pain medication, you can comfortably wear a seatbelt, and you can safely maneuver your car and apply brakes. b. RETURN TO WORK:  ______________________________________________________________________________________ 9. You should see your doctor in the office for a follow-up appointment approximately two weeks after your surgery.  Your doctors nurse will typically make your follow-up appointment when she calls you with your pathology report.  Expect your pathology report 3-4 business days after your surgery.  You may call to check if you do not hear from [place] after three days. 10. OTHER INSTRUCTIONS: _______________________________________________________________________________________________ _____________________________________________________________________________________________________________________________________ _____________________________________________________________________________________________________________________________________ _____________________________________________________________________________________________________________________________________  WHEN TO CALL DR WAKEFIELD: 1. Fever over 101.0 2. Nausea and/or vomiting. 3. Extreme swelling or bruising. 4. Continued bleeding from incision. 5. Increased pain, redness, or drainage from the incision.  The clinic staff is available to answer your questions during regular business hours.  Please dont hesitate to call and ask to speak to one of the nurses for clinical concerns.  If you have a medical emergency, go to the nearest emergency room or call 911.  A surgeon from [facility] is always on call at the hospital.  For further questions, please visit [email or web address]

## 2019-03-15 NOTE — Anesthesia Preprocedure Evaluation (Addendum)
Anesthesia Evaluation  Patient identified by MRN, date of birth, ID band Patient awake    Reviewed: Allergy & Precautions, NPO status , Patient's Chart, lab work & pertinent test results  Airway Mallampati: II  TM Distance: >3 FB     Dental   Pulmonary neg pulmonary ROS, former smoker,    breath sounds clear to auscultation       Cardiovascular negative cardio ROS   Rhythm:Regular Rate:Normal     Neuro/Psych    GI/Hepatic negative GI ROS, Neg liver ROS,   Endo/Other  negative endocrine ROS  Renal/GU negative Renal ROS     Musculoskeletal   Abdominal   Peds  Hematology   Anesthesia Other Findings   Reproductive/Obstetrics                             Anesthesia Physical Anesthesia Plan  ASA: III  Anesthesia Plan: General   Post-op Pain Management:    Induction: Intravenous  PONV Risk Score and Plan: Ondansetron, Dexamethasone and Midazolam  Airway Management Planned: LMA  Additional Equipment:   Intra-op Plan:   Post-operative Plan: Extubation in OR  Informed Consent: I have reviewed the patients History and Physical, chart, labs and discussed the procedure including the risks, benefits and alternatives for the proposed anesthesia with the patient or authorized representative who has indicated his/her understanding and acceptance.     Dental advisory given  Plan Discussed with: CRNA and Anesthesiologist  Anesthesia Plan Comments:         Anesthesia Quick Evaluation

## 2019-03-15 NOTE — H&P (Signed)
58 yof referred by Dr Lindi Adie for new right breast cancer. 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 she has quit smoking recenltly .she is retired. she has no prior breast history. she has no mass or dc. she does have fh of bca in maternal aunt and possible ovarian cancer in pgm at age 58. she underwent mm that showed a right breast asymmetry on screen. this is 1.5 cm on mm and [place]. she has b density breasts. ax [place] is negative. 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 she is here with her husband to discuss options. she lives in [place].  Past Surgical History Tawni Pummel, RN; 02/28/2019 7:21 AM) Colon Polyp Removal - Colonoscopy   Diagnostic Studies History Tawni Pummel, RN; 02/28/2019 7:21 AM) Colonoscopy  1-5 years ago Mammogram  within last year Pap Smear  1-5 years ago  Medication History Tawni Pummel, RN; 02/28/2019 7:21 AM) Medications Reconciled  Social History Tawni Pummel, RN; 02/28/2019 7:21 AM) Caffeine use  Carbonated beverages, Coffee. No alcohol use  No drug use  Tobacco use  Current some day smoker.  Family History Tawni Pummel, RN; 02/28/2019 7:21 AM) Arthritis  Family Members In General. Breast Cancer  Family Members In General, Mother. Cancer  Father. Colon Cancer  Family Members In General. Colon Polyps  Father. Diabetes Mellitus  Father. Heart Disease  Father. Hypertension  Father.  Pregnancy / Birth History Tawni Pummel, RN; 02/28/2019 7:21 AM) Age at menarche  58 years. Age of menopause  44-50 Contraceptive History  Oral contraceptives. Gravida  0 Para  0  Other Problems Tawni Pummel, RN; 02/28/2019 7:21 AM) Arthritis  Back Pain  Cancer    Review of Systems Sunday Spillers Ledford RN; 02/28/2019 7:21 AM) General Not Present- Appetite Loss, Chills, Fatigue, Fever, Night Sweats, Weight Gain and Weight Loss. Skin Not Present-  Change in Wart/Mole, Dryness, Hives, Jaundice, New Lesions, Non-Healing Wounds, Rash and Ulcer. HEENT Present- Wears glasses/contact lenses. Not Present- Earache, Hearing Loss, Hoarseness, Nose Bleed, Oral Ulcers, Ringing in the Ears, Seasonal Allergies, Sinus Pain, Sore Throat, Visual Disturbances and Yellow Eyes. Breast Not Present- Breast Mass, Breast Pain, Nipple Discharge and Skin Changes. Cardiovascular Not Present- Chest Pain, Difficulty Breathing Lying Down, Leg Cramps, Palpitations, Rapid Heart Rate, Shortness of Breath and Swelling of Extremities. Gastrointestinal Not Present- Abdominal Pain, Bloating, Bloody Stool, Change in Bowel Habits, Chronic diarrhea, Constipation, Difficulty Swallowing, Excessive gas, Gets full quickly at meals, Hemorrhoids, Indigestion, Nausea, Rectal Pain and Vomiting. Female Genitourinary Not Present- Frequency, Nocturia, Painful Urination, Pelvic Pain and Urgency. Musculoskeletal Present- Joint Stiffness. Not Present- Back Pain, Joint Pain, Muscle Pain, Muscle Weakness and Swelling of Extremities. Neurological Not Present- Decreased Memory, Fainting, Headaches, Numbness, Seizures, Tingling, Tremor, Trouble walking and Weakness. Psychiatric Not Present- Anxiety, Bipolar, Change in Sleep Pattern, Depression, Fearful and Frequent crying. Endocrine Not Present- Cold Intolerance, Excessive Hunger, Hair Changes, Heat Intolerance, Hot flashes and New Diabetes. Hematology Not Present- Blood Thinners, Easy Bruising, Excessive bleeding, Gland problems, HIV and Persistent Infections.   Physical Exam Rolm Bookbinder MD; 02/28/2019 2:28 PM) General Mental Status-Alert. Head and Neck Trachea-midline. Thyroid Gland Characteristics - normal size and consistency. Eye Sclera/Conjunctiva - Bilateral-No scleral icterus. Chest and Lung Exam Chest and lung exam reveals -quiet, even and easy respiratory effort with no use of accessory muscles. Breast Nipples-No  Discharge. Breast Lump-No Palpable Breast Mass. Cardiovascular Cardiovascular examination reveals -normal heart sounds,  regular rate and rhythm with no murmurs. Abdomen Note: soft nt no hepatomegaly Neurologic Neurologic evaluation reveals -alert and oriented x 3 with no impairment of recent or remote memory. Lymphatic Head & Neck General Head & Neck Lymphatics: Bilateral - Description - Normal. Axillary General Axillary Region: Bilateral - Description - Normal. Note: no Yoder adenopathy   Assessment & Plan Rolm Bookbinder MD; 02/28/2019 2:32 PM) CARCINOMA OF UPPER-INNER QUADRANT OF RIGHT FEMALE BREAST (C50.211) Story: Right breast seed guided lumpectomy, right ax sn biopsy We discussed the staging and pathophysiology of breast cancer. We discussed all of the different options for treatment for breast cancer including surgery, chemotherapy, radiation therapy, Herceptin, and antiestrogen therapy. We discussed a sentinel lymph node biopsy as she does not appear to having lymph node involvement right now. We discussed that there is a chance of having a positive node with a sentinel lymph node biopsy and we will await the permanent pathology to make any other first further decisions in terms of her treatment. One of these options might be to return to the operating room to perform an axillary lymph node dissection. We discussed up to a 5% risk lifetime of chronic shoulder pain as well as lymphedema associated with a sentinel lymph node biopsy. We discussed the options for treatment of the breast cancer which included lumpectomy versus a mastectomy. We discussed the performance of the lumpectomy with radioactive seed placement. We discussed a 5-10% chance of a positive margin requiring reexcision in the operating room. We also discussed that she will need radiation therapy if she undergoes lumpectomy. We discussed mastectomy and the postoperative care for that as well. Mastectomy can be  followed by reconstruction. The decision for lumpectomy vs mastectomy has no impact on decision for chemotherapy. Most mastectomy patients will not need radiation therapy. We discussed that there is no difference in her survival whether she undergoes lumpectomy with radiation therapy or antiestrogen therapy versus a mastectomy. There is also no real difference between her recurrence in the breast. We discussed the risks of operation including bleeding, infection, possible reoperation. She understands her further therapy will be based on what her stage is at the time of her operation.

## 2019-03-15 NOTE — Op Note (Signed)
Preoperative diagnosis: Clinical stage I right breast cancer Postoperative diagnosis: Same as above Procedure: 1.  Right breast radioactive seed guided lumpectomy 2.  Right deep axillary sentinel lymph node biopsy Surgeon: Dr. Serita Grammes Anesthesia: General Estimated blood loss: 20 cc Specimens: 1.  Right breast tissue marked with paint 2.  Right deep axillary sentinel lymph nodes with highest count of 382 Complications: None Drains: None Sponge and needle count was correct at completion Disposition to recovery in stable condition  Indications: This is a 58 year old female with a clinical stage I right breast cancer.  We discussed all of her options in the multidisciplinary clinic and have elected proceed with lumpectomy and sentinel node biopsy.  She had a radioactive seed placed prior to beginning.  I had the mammograms in the operating room.  Procedure: After informed consent was obtained the fate patient first underwent a pectoral block.  She had technetium injected in the standard periareolar fashion.  She was given antibiotics.  SCDs were in place.  She was then placed under general anesthesia without complication.  She was prepped and draped in the standard sterile surgical fashion.  A surgical timeout was then performed.  I filtrated Marcaine around the areola.  I made a periareolar incision in order to hide the scar later.  I then used the neoprobe to dissect down to the seed and the cancer.  I then remove this with an attempt to get a clear margin around all of this.  Mammogram confirmed removal of the seed and the mass.  On the CT images it looks like all my margins were clear.  I then placed clips in the cavity.  I obtained hemostasis.  I closed this with 2-0 Vicryl, 3-0 Vicryl, and 5-0 Monocryl.  Glue and Steri-Strips were applied.  I then located the sentinel node in the low axilla.  I infiltrated Marcaine and made an incision below the axillary hairline.  I carried this  through the axillary fascia.  I identified several sentinel lymph nodes that were radioactive.  I removed these together.  There was no background radioactivity.  Hemostasis was observed.  I then closed this with 2-0 Vicryl, 3-0 Vicryl, and 4-0 Monocryl.  Glue and Steri-Strips were applied.  She tolerated this well was extubated and transferred to recovery stable.

## 2019-03-15 NOTE — Progress Notes (Signed)
Assisted Dr. Green with right, ultrasound guided, pectoralis block. Side rails up, monitors on throughout procedure. See vital signs in flow sheet. Tolerated Procedure well. 

## 2019-03-15 NOTE — Anesthesia Procedure Notes (Signed)
Procedure Name: LMA Insertion Performed by: Angelus Hoopes, Jaylean W, CRNA Pre-anesthesia Checklist: Patient identified, Emergency Drugs available, Suction available and Patient being monitored Patient Re-evaluated:Patient Re-evaluated prior to induction Oxygen Delivery Method: Circle system utilized Preoxygenation: Pre-oxygenation with 100% oxygen Induction Type: IV induction Ventilation: Mask ventilation without difficulty LMA: LMA inserted LMA Size: 4.0 Number of attempts: 1 Placement Confirmation: positive ETCO2 Tube secured with: Tape Dental Injury: Teeth and Oropharynx as per pre-operative assessment        

## 2019-03-16 ENCOUNTER — Encounter (HOSPITAL_BASED_OUTPATIENT_CLINIC_OR_DEPARTMENT_OTHER): Payer: Self-pay | Admitting: General Surgery

## 2019-03-16 NOTE — Addendum Note (Signed)
Addendum  created 03/16/19 1414 by Belinda Block, MD   Clinical Note Signed, Intraprocedure Blocks edited

## 2019-03-20 ENCOUNTER — Telehealth: Payer: Self-pay | Admitting: *Deleted

## 2019-03-20 NOTE — Telephone Encounter (Signed)
Ordered oncotype per Dr. Lindi Adie.  Requistion faxed to pathology and confirmed receipt. Spoke with patient to cancel her appointment with Dr. Lindi Adie on 4/3 due to COVID-19 and we will call her with the oncotype results.

## 2019-03-23 ENCOUNTER — Ambulatory Visit: Payer: 59 | Admitting: Hematology and Oncology

## 2019-03-27 DIAGNOSIS — Z170 Estrogen receptor positive status [ER+]: Secondary | ICD-10-CM | POA: Diagnosis not present

## 2019-03-27 DIAGNOSIS — C50411 Malignant neoplasm of upper-outer quadrant of right female breast: Secondary | ICD-10-CM | POA: Diagnosis not present

## 2019-03-28 ENCOUNTER — Encounter (HOSPITAL_COMMUNITY): Payer: Self-pay | Admitting: Hematology and Oncology

## 2019-03-28 ENCOUNTER — Telehealth: Payer: Self-pay | Admitting: *Deleted

## 2019-03-28 DIAGNOSIS — Z170 Estrogen receptor positive status [ER+]: Secondary | ICD-10-CM

## 2019-03-28 DIAGNOSIS — C50411 Malignant neoplasm of upper-outer quadrant of right female breast: Secondary | ICD-10-CM

## 2019-03-28 DIAGNOSIS — Z803 Family history of malignant neoplasm of breast: Secondary | ICD-10-CM

## 2019-03-28 NOTE — Telephone Encounter (Signed)
Received oncotype score of 12/3%. Physician team notified. Called pt and discussed chemo not recommended. Informed next step is xrt with Dr. Isidore Moos.

## 2019-03-30 NOTE — Progress Notes (Signed)
Location of Breast Cancer: Right Breast  Histology per Pathology Report:  02/19/19 Diagnosis Breast, right, needle core biopsy, 12 o'clock, 3 cm fn - INVASIVE DUCTAL CARCINOMA.  Receptor Status: ER(95%), PR (40%), Her2-neu (NEG), Ki-(5%)  03/15/19 Diagnosis 1. Breast, lumpectomy, Right w/seed - INVASIVE DUCTAL CARCINOMA, 1.5 CM, NOTTINGHAM GRADE 2 OF 3. - MARGINS OF RESECTION ARE NOT INVOLVED (CLOSEST MARGIN: 2 MM, LATERAL). - BIOPSY SITE CHANGES. - SEE ONCOLOGY TABLE. 2. Lymph node, sentinel, biopsy, Right Axillary - ONE LYMPH NODE, NEGATIVE FOR CARCINOMA (0/1). 3. Lymph node, sentinel, biopsy, Right - ONE LYMPH NODE, NEGATIVE FOR CARCINOMA (0/1). 4. Lymph node, sentinel, biopsy, Right - ONE LYMPH NODE, NEGATIVE FOR CARCINOMA (0/1). 5. Lymph node, sentinel, biopsy, Right - ONE LYMPH NODE, NEGATIVE FOR CARCINOMA (0/1).  Did patient present with symptoms or was this found on screening mammography?: It was found on a screening mammogram.   Past/Anticipated interventions by surgeon, if any: 03/15/19 Procedure: 1.  Right breast radioactive seed guided lumpectomy 2.  Right deep axillary sentinel lymph node biopsy Surgeon: Dr. Serita Grammes  Past/Anticipated interventions by medical oncology, if any:  02/28/19 Breast Clinic, Dr. Lindi Adie Recommendations: 1. Breast conserving surgery followed by 2. Oncotype DX testing to determine if chemotherapy would be of any benefit followed by 3. Adjuvant radiation therapy followed by 4. Adjuvant antiestrogen therapy  Per note 03/28/19 from breast navigator, chemotherapy not recommended  03/12/19 Dr. Lindi Adie I received information from Dr. Donne Hazel that surgery is being postponed. Because of this I recommended starting the patient on anastrozole therapy. I sent a prescription to her pharmacy. If she calls back please counsel her regarding anastrozole toxicities and benefits.  Have her call us if she has any trouble taking this medication.          Lymphedema issues, if any: She denies. She has good arm mobility.    Pain issues, if any:  She denies.   SAFETY ISSUES:  Prior radiation? Yes, tongue in early 2001 at [facility].   Pacemaker/ICD? No  Possible current pregnancy? No  Is the patient on methotrexate? No  Current Complaints / other details:      Annet Manukyan, Stephani Police, RN 03/30/2019,12:26 PM

## 2019-04-09 NOTE — Progress Notes (Signed)
Radiation Oncology         [phone] ________________________________  Name: Sherry Rivera MRN: [id number]  Date: 04/10/2019  DOB: [birth year]  Follow-Up WebEx Telemedicine Note  Outpatient  CC: [facility], [facility], [facility]  Diagnosis:      ICD-10-CM   1. Malignant neoplasm of upper-outer quadrant of right breast in female, estrogen receptor positive ([place]) C50.411    Z17.0      Cancer Staging Malignant neoplasm of upper-outer quadrant of right breast in female, estrogen receptor positive ([place]) Staging form: Breast, AJCC 8th Edition - Clinical stage from 02/28/2019: Stage IA (cT1b, cN0, cM0, G2, ER+, PR+, HER2-) - Signed by Nicholas Lose, MD on 02/28/2019 - Pathologic: Stage IA (pT1c, pN0, cM0, G2, ER+, PR+, HER2-) - Signed by Eppie Gibson, MD on 04/11/2019   CHIEF COMPLAINT:  right breast cancer  Narrative:  The patient returns today for follow-up to discuss radiation treatment options.     Since consultation date, she received genetic testing results on 03/07/2019 revealing: negative genetic testing, a VUS in ATM was identified.  She opted to proceed with right breast lumpectomy with right sentinel lymph node biopsies on date of 03/15/2019 with pathology report revealing: tumor size of 1.5 cm; histology of invasive ductal carcinoma; margin status to invasive disease of 2 mm to lateral margin; nodal status of negative (0/4); ER status: 95%; PR status 40%, Her2 status negative; Grade 2.  A sample was submitted for oncotype testing. The Oncotype DX score was 12 - no significant benefit from chemotherapy.  Symptomatically, the patient reports: good arm mobility, healing well, feels well. [place], is self-isolating         ALLERGIES:  has No Known Allergies.  Meds: Current Outpatient Medications  Medication Sig Dispense Refill  . acetaminophen (TYLENOL) 500 MG tablet Take 1,000 mg by mouth every 6 (six) hours as needed for headache.     . calcium carbonate (OSCAL) 1500 (600 Ca) MG TABS tablet Take 600 mg of elemental calcium by mouth daily.    Marland Kitchen glucosamine-chondroitin 500-400 MG tablet Take 1 tablet by mouth 3 (three) times daily.    . nicotine polacrilex (COMMIT) 2 MG lozenge Take 2 mg by mouth as needed for smoking cessation.    [name] 3-6-9 Fatty Acids (OMEGA 3-6-9 PO) Take 1 capsule by mouth daily.    . Triamcinolone Acetonide (NASACORT ALLERGY 24HR NA) Place into the nose.    Marland Kitchen anastrozole (ARIMIDEX) 1 MG tablet Take 1 tablet (1 mg total) by mouth daily. (Patient not taking: Reported on 04/10/2019) 90 tablet 3  . oxyCODONE (OXY IR/ROXICODONE) 5 MG immediate release tablet Take 1 tablet (5 mg total) by mouth every 6 (six) hours as needed for moderate pain, severe pain or breakthrough pain. (Patient not taking: Reported on 04/10/2019) 10 tablet 0   No current facility-administered medications for this encounter.     Physical Findings:  vitals were not taken for this visit. .    General: Alert and oriented, in no acute distress    Lab Findings: Lab Results  Component Value Date   WBC 6.5 02/28/2019   HGB 13.8 02/28/2019   HCT 42.8 02/28/2019   MCV 94.9 02/28/2019   PLT 235 02/28/2019    Radiographic Findings: Nm Sentinel Node Inj-no Rpt (breast)  Result Date: 03/15/2019 Sulfur colloid was injected by the nuclear medicine technologist for melanoma sentinel node.    Impression/Plan: Right Breast IDC, ER+/PR+/Her2-  We discussed  adjuvant radiotherapy today.  I recommend 4 weeks directed at the right breast in order to reduce the risk of locoregional recurrence by 2/3.  The risks, benefits and side effects of this treatment were discussed in detail.  She understands that radiotherapy is associated with skin irritation and fatigue in the acute setting. Late effects can include cosmetic changes and rare injury to internal organs.  She is enthusiastic about proceeding with treatment.   We discussed the pros/ cons of  radiotherapy now vs later while carefully considering her particular oncologic condition and also factoring the risks of the COVID 19 pandemic.  We are in agreement that it makes sense to delay radiotherapy to maximize her ability to stay at home and reduce the risk of catching the virus outside the home.  I let her know that I will share the above plan with medical oncology so that they can consider starting antiestrogen therapy for her sooner rather than later.  She is very pleased with this plan.  I requested rad onc scheduling to arrange a follow-up in person with me, plus CT simulation/treatment planning, around early June, once we anticipate that the pandemic will be less severe.  I contacted medical oncology to start her antiestrogen pill now, if they see fit.  This encounter was provided by telemedicine platform Webex.  The patient has given verbal consent for this type of encounter and has been advised to only accept a meeting of this type in a secure network environment. The time spent during this encounter was 16 minutes. The attendants for this meeting include Eppie Gibson  and Darlyn Chamber.  During the encounter, Eppie Gibson was located at [facility] Radiation Oncology Department.  Sherry Rivera was located at home.   _____________________________________   Eppie Gibson, MD   This document serves as a record of services personally performed by Eppie Gibson, MD. It was created on her behalf by Wilburn Mylar, a trained medical scribe. The creation of this record is based on the scribe's personal observations and the provider's statements to them. This document has been checked and approved by the attending provider.

## 2019-04-10 ENCOUNTER — Ambulatory Visit
Admission: RE | Admit: 2019-04-10 | Discharge: 2019-04-10 | Disposition: A | Discharge status: Home / Self Care | Payer: 59 | Source: Ambulatory Visit | Attending: Radiation Oncology | Admitting: Radiation Oncology

## 2019-04-10 ENCOUNTER — Telehealth: Payer: Self-pay | Admitting: *Deleted

## 2019-04-10 ENCOUNTER — Encounter: Payer: Self-pay | Admitting: Radiation Oncology

## 2019-04-10 ENCOUNTER — Other Ambulatory Visit: Payer: Self-pay

## 2019-04-10 DIAGNOSIS — C50411 Malignant neoplasm of upper-outer quadrant of right female breast: Secondary | ICD-10-CM | POA: Diagnosis not present

## 2019-04-10 DIAGNOSIS — Z170 Estrogen receptor positive status [ER+]: Secondary | ICD-10-CM | POA: Diagnosis not present

## 2019-04-10 DIAGNOSIS — Z9889 Other specified postprocedural states: Secondary | ICD-10-CM | POA: Diagnosis not present

## 2019-04-10 HISTORY — DX: Unspecified osteoarthritis, unspecified site: M19.90

## 2019-04-10 HISTORY — DX: Malignant (primary) neoplasm, unspecified: C80.1

## 2019-04-10 NOTE — Telephone Encounter (Signed)
Left vm instructing pt to take anastrozole as prescribed. Contact information provided for questions or needs.

## 2019-04-11 ENCOUNTER — Ambulatory Visit: Payer: 59 | Admitting: Radiation Oncology

## 2019-04-11 ENCOUNTER — Encounter: Payer: Self-pay | Admitting: Radiation Oncology

## 2019-05-21 ENCOUNTER — Other Ambulatory Visit: Payer: Self-pay

## 2019-05-21 ENCOUNTER — Ambulatory Visit
Admission: RE | Admit: 2019-05-21 | Discharge: 2019-05-21 | Disposition: A | Discharge status: Home / Self Care | Payer: 59 | Source: Ambulatory Visit | Attending: Radiation Oncology | Admitting: Radiation Oncology

## 2019-05-21 ENCOUNTER — Encounter: Payer: Self-pay | Admitting: *Deleted

## 2019-05-21 DIAGNOSIS — Z170 Estrogen receptor positive status [ER+]: Secondary | ICD-10-CM | POA: Diagnosis present

## 2019-05-21 DIAGNOSIS — C50411 Malignant neoplasm of upper-outer quadrant of right female breast: Secondary | ICD-10-CM | POA: Insufficient documentation

## 2019-05-22 ENCOUNTER — Telehealth: Payer: Self-pay | Admitting: Hematology and Oncology

## 2019-05-22 NOTE — Progress Notes (Signed)
  Radiation Oncology         [phone] ________________________________  Name: Sherry Rivera MRN: [id number]  Date: 05/21/2019  DOB: [birth year]  SIMULATION AND TREATMENT PLANNING NOTE    Outpatient  DIAGNOSIS:     ICD-10-CM   1. Malignant neoplasm of upper-outer quadrant of right breast in female, estrogen receptor positive ([place]) C50.411    Z17.0     NARRATIVE:  The patient was brought to the [facility].  Identity was confirmed.  All relevant records and images related to the planned course of therapy were reviewed.  The patient freely provided informed written consent to proceed with treatment after reviewing the details related to the planned course of therapy. The consent form was witnessed and verified by the simulation staff.    Then, the patient was set-up in a stable reproducible supine position for radiation therapy with her ipsilateral arm over her head, and her upper body secured in a custom-made Vac-lok device.  CT images were obtained.  Surface markings were placed.  The CT images were loaded into the planning software.    TREATMENT PLANNING NOTE: Treatment planning then occurred.  The radiation prescription was entered and confirmed.     A total of 3 medically necessary complex treatment devices were fabricated and supervised by me: 2 fields with MLCs for custom blocks to protect heart, and lungs;  and, a Vac-lok. MORE COMPLEX DEVICES MAY BE MADE IN DOSIMETRY FOR FIELD IN FIELD BEAMS FOR DOSE HOMOGENEITY.  I have requested : 3D Simulation which is medically necessary to give adequate dose to at risk tissues while sparing lungs and heart.  I have requested a DVH of the following structures: lungs, heart, lumpectomy cavity.    The patient will receive 42.56 Gy in 16 fractions to the right breast with 2 tangential fields.   This will not be followed by a boost.  The reason for no boost is because her clips appear to have migrated and the lumpectomy cavity is  poorly defined on her CT images.  She also has negative margins.    Optical Surface Tracking Plan:  Since intensity modulated radiotherapy (IMRT) and 3D conformal radiation treatment methods are predicated on accurate and precise positioning for treatment, intrafraction motion monitoring is medically necessary to ensure accurate and safe treatment delivery. The ability to quantify intrafraction motion without excessive ionizing radiation dose can only be performed with optical surface tracking. Accordingly, surface imaging offers the opportunity to obtain 3D measurements of patient position throughout IMRT and 3D treatments without excessive radiation exposure. I am ordering optical surface tracking for this patient's upcoming course of radiotherapy.  ________________________________   Reference:  Ursula Alert, J, et al. Surface imaging-based analysis of intrafraction motion for breast radiotherapy patients.Journal of [place], n. 6, nov. 2014. ISSN [id number].  Available at: <http://www.jacmp.org/index.php/jacmp/article/view/4957>.    -----------------------------------  Eppie Gibson, MD

## 2019-05-22 NOTE — Telephone Encounter (Signed)
Scheduled appt per sch msg. Called and spoke with patient. Confirmed date and time °

## 2019-05-23 DIAGNOSIS — C50411 Malignant neoplasm of upper-outer quadrant of right female breast: Secondary | ICD-10-CM | POA: Diagnosis not present

## 2019-05-28 ENCOUNTER — Ambulatory Visit
Admission: RE | Admit: 2019-05-28 | Discharge: 2019-05-28 | Disposition: A | Discharge status: Home / Self Care | Payer: 59 | Source: Ambulatory Visit | Attending: Radiation Oncology | Admitting: Radiation Oncology

## 2019-05-28 ENCOUNTER — Other Ambulatory Visit: Payer: Self-pay

## 2019-05-28 DIAGNOSIS — C50411 Malignant neoplasm of upper-outer quadrant of right female breast: Secondary | ICD-10-CM | POA: Diagnosis not present

## 2019-05-28 MED ORDER — ALRA NON-METALLIC DEODORANT (RAD-ONC)
1.0000 "application " | Freq: Once | TOPICAL | Status: AC
  Administered 2019-05-28: 1 via TOPICAL

## 2019-05-28 MED ORDER — RADIAPLEXRX EX GEL
Freq: Two times a day (BID) | CUTANEOUS | Status: DC
  Administered 2019-05-28: 15:00:00 via TOPICAL

## 2019-05-29 ENCOUNTER — Other Ambulatory Visit: Payer: Self-pay

## 2019-05-29 ENCOUNTER — Ambulatory Visit
Admission: RE | Admit: 2019-05-29 | Discharge: 2019-05-29 | Disposition: A | Discharge status: Home / Self Care | Payer: 59 | Source: Ambulatory Visit | Attending: Radiation Oncology | Admitting: Radiation Oncology

## 2019-05-29 DIAGNOSIS — C50411 Malignant neoplasm of upper-outer quadrant of right female breast: Secondary | ICD-10-CM | POA: Diagnosis not present

## 2019-05-30 ENCOUNTER — Ambulatory Visit
Admission: RE | Admit: 2019-05-30 | Discharge: 2019-05-30 | Disposition: A | Discharge status: Home / Self Care | Payer: 59 | Source: Ambulatory Visit | Attending: Radiation Oncology | Admitting: Radiation Oncology

## 2019-05-30 ENCOUNTER — Other Ambulatory Visit: Payer: Self-pay

## 2019-05-30 DIAGNOSIS — C50411 Malignant neoplasm of upper-outer quadrant of right female breast: Secondary | ICD-10-CM | POA: Diagnosis not present

## 2019-05-31 ENCOUNTER — Other Ambulatory Visit: Payer: Self-pay

## 2019-05-31 ENCOUNTER — Ambulatory Visit
Admission: RE | Admit: 2019-05-31 | Discharge: 2019-05-31 | Disposition: A | Discharge status: Home / Self Care | Payer: 59 | Source: Ambulatory Visit | Attending: Radiation Oncology | Admitting: Radiation Oncology

## 2019-05-31 DIAGNOSIS — C50411 Malignant neoplasm of upper-outer quadrant of right female breast: Secondary | ICD-10-CM | POA: Diagnosis not present

## 2019-06-01 ENCOUNTER — Other Ambulatory Visit: Payer: Self-pay

## 2019-06-01 ENCOUNTER — Ambulatory Visit
Admission: RE | Admit: 2019-06-01 | Discharge: 2019-06-01 | Disposition: A | Discharge status: Home / Self Care | Payer: 59 | Source: Ambulatory Visit | Attending: Radiation Oncology | Admitting: Radiation Oncology

## 2019-06-01 DIAGNOSIS — C50411 Malignant neoplasm of upper-outer quadrant of right female breast: Secondary | ICD-10-CM | POA: Diagnosis not present

## 2019-06-04 ENCOUNTER — Other Ambulatory Visit: Payer: Self-pay

## 2019-06-04 ENCOUNTER — Ambulatory Visit
Admission: RE | Admit: 2019-06-04 | Discharge: 2019-06-04 | Disposition: A | Discharge status: Home / Self Care | Payer: 59 | Source: Ambulatory Visit | Attending: Radiation Oncology | Admitting: Radiation Oncology

## 2019-06-04 DIAGNOSIS — C50411 Malignant neoplasm of upper-outer quadrant of right female breast: Secondary | ICD-10-CM | POA: Diagnosis not present

## 2019-06-05 ENCOUNTER — Ambulatory Visit
Admission: RE | Admit: 2019-06-05 | Discharge: 2019-06-05 | Disposition: A | Discharge status: Home / Self Care | Payer: 59 | Source: Ambulatory Visit | Attending: Radiation Oncology | Admitting: Radiation Oncology

## 2019-06-05 ENCOUNTER — Other Ambulatory Visit: Payer: Self-pay

## 2019-06-05 DIAGNOSIS — C50411 Malignant neoplasm of upper-outer quadrant of right female breast: Secondary | ICD-10-CM | POA: Diagnosis not present

## 2019-06-06 ENCOUNTER — Ambulatory Visit
Admission: RE | Admit: 2019-06-06 | Discharge: 2019-06-06 | Disposition: A | Discharge status: Home / Self Care | Payer: 59 | Source: Ambulatory Visit | Attending: Radiation Oncology | Admitting: Radiation Oncology

## 2019-06-06 ENCOUNTER — Other Ambulatory Visit: Payer: Self-pay

## 2019-06-06 DIAGNOSIS — C50411 Malignant neoplasm of upper-outer quadrant of right female breast: Secondary | ICD-10-CM | POA: Diagnosis not present

## 2019-06-07 ENCOUNTER — Ambulatory Visit
Admission: RE | Admit: 2019-06-07 | Discharge: 2019-06-07 | Disposition: A | Discharge status: Home / Self Care | Payer: 59 | Source: Ambulatory Visit | Attending: Radiation Oncology | Admitting: Radiation Oncology

## 2019-06-07 ENCOUNTER — Other Ambulatory Visit: Payer: Self-pay

## 2019-06-07 DIAGNOSIS — C50411 Malignant neoplasm of upper-outer quadrant of right female breast: Secondary | ICD-10-CM | POA: Diagnosis not present

## 2019-06-08 ENCOUNTER — Ambulatory Visit
Admission: RE | Admit: 2019-06-08 | Discharge: 2019-06-08 | Disposition: A | Discharge status: Home / Self Care | Payer: 59 | Source: Ambulatory Visit | Attending: Radiation Oncology | Admitting: Radiation Oncology

## 2019-06-08 ENCOUNTER — Other Ambulatory Visit: Payer: Self-pay

## 2019-06-08 DIAGNOSIS — C50411 Malignant neoplasm of upper-outer quadrant of right female breast: Secondary | ICD-10-CM | POA: Diagnosis not present

## 2019-06-11 ENCOUNTER — Ambulatory Visit
Admission: RE | Admit: 2019-06-11 | Discharge: 2019-06-11 | Disposition: A | Discharge status: Home / Self Care | Payer: 59 | Source: Ambulatory Visit | Attending: Radiation Oncology | Admitting: Radiation Oncology

## 2019-06-11 ENCOUNTER — Other Ambulatory Visit: Payer: Self-pay

## 2019-06-11 DIAGNOSIS — C50411 Malignant neoplasm of upper-outer quadrant of right female breast: Secondary | ICD-10-CM | POA: Diagnosis not present

## 2019-06-12 ENCOUNTER — Ambulatory Visit
Admission: RE | Admit: 2019-06-12 | Discharge: 2019-06-12 | Disposition: A | Discharge status: Home / Self Care | Payer: 59 | Source: Ambulatory Visit | Attending: Radiation Oncology | Admitting: Radiation Oncology

## 2019-06-12 ENCOUNTER — Other Ambulatory Visit: Payer: Self-pay

## 2019-06-12 DIAGNOSIS — C50411 Malignant neoplasm of upper-outer quadrant of right female breast: Secondary | ICD-10-CM | POA: Diagnosis not present

## 2019-06-13 ENCOUNTER — Other Ambulatory Visit: Payer: Self-pay

## 2019-06-13 ENCOUNTER — Ambulatory Visit
Admission: RE | Admit: 2019-06-13 | Discharge: 2019-06-13 | Disposition: A | Discharge status: Home / Self Care | Payer: 59 | Source: Ambulatory Visit | Attending: Radiation Oncology | Admitting: Radiation Oncology

## 2019-06-13 DIAGNOSIS — C50411 Malignant neoplasm of upper-outer quadrant of right female breast: Secondary | ICD-10-CM | POA: Diagnosis not present

## 2019-06-14 ENCOUNTER — Other Ambulatory Visit: Payer: Self-pay

## 2019-06-14 ENCOUNTER — Ambulatory Visit
Admission: RE | Admit: 2019-06-14 | Discharge: 2019-06-14 | Disposition: A | Discharge status: Home / Self Care | Payer: 59 | Source: Ambulatory Visit | Attending: Radiation Oncology | Admitting: Radiation Oncology

## 2019-06-14 DIAGNOSIS — C50411 Malignant neoplasm of upper-outer quadrant of right female breast: Secondary | ICD-10-CM | POA: Diagnosis not present

## 2019-06-15 ENCOUNTER — Other Ambulatory Visit: Payer: Self-pay

## 2019-06-15 ENCOUNTER — Ambulatory Visit
Admission: RE | Admit: 2019-06-15 | Discharge: 2019-06-15 | Disposition: A | Discharge status: Home / Self Care | Payer: 59 | Source: Ambulatory Visit | Attending: Radiation Oncology | Admitting: Radiation Oncology

## 2019-06-15 DIAGNOSIS — C50411 Malignant neoplasm of upper-outer quadrant of right female breast: Secondary | ICD-10-CM | POA: Diagnosis not present

## 2019-06-18 ENCOUNTER — Ambulatory Visit: Payer: 59 | Admitting: Radiation Oncology

## 2019-06-18 ENCOUNTER — Ambulatory Visit: Payer: 59

## 2019-06-18 ENCOUNTER — Ambulatory Visit
Admission: RE | Admit: 2019-06-18 | Discharge: 2019-06-18 | Disposition: A | Discharge status: Home / Self Care | Payer: 59 | Source: Ambulatory Visit | Attending: Radiation Oncology | Admitting: Radiation Oncology

## 2019-06-18 ENCOUNTER — Encounter: Payer: Self-pay | Admitting: Radiation Oncology

## 2019-06-18 DIAGNOSIS — C50411 Malignant neoplasm of upper-outer quadrant of right female breast: Secondary | ICD-10-CM | POA: Diagnosis not present

## 2019-06-18 NOTE — Assessment & Plan Note (Signed)
02/19/2019: Screening detected right breast asymmetry by ultrasound measured 7 mm, biopsy revealed grade 2 IDC ER 95%, PR 40%, Ki-67 5%, HER-2 1+ negative, T1BN0 stage Ia clinical stage  03/15/2019:Right lumpectomy: Grade 2 IDC 1.5 cm, margins negative, 0/4 lymph nodes negative, ER 95%, PR 40%, HER-2 1+, negative, Ki-67 5%, T1c M0 stage Ia  Oncotype DX recurrence score 12, distant recurrence at 9 years: 3% Adjuvant radiation therapy: 05/29/2019-  Treatment plan: Adjuvant antiestrogen therapy with anastrozole 1 mg daily x7 years Anastrozole counseling:We discussed the risks and benefits of anti-estrogen therapy with aromatase inhibitors. These include but not limited to insomnia, hot flashes, mood changes, vaginal dryness, bone density loss, and weight gain. We strongly believe that the benefits far outweigh the risks. Patient understands these risks and consented to starting treatment. Planned treatment duration is 7 years.  Return to clinic in 3 months for survivorship care plan visit

## 2019-06-19 ENCOUNTER — Ambulatory Visit: Payer: 59

## 2019-06-20 ENCOUNTER — Ambulatory Visit: Payer: 59

## 2019-06-20 DIAGNOSIS — C50411 Malignant neoplasm of upper-outer quadrant of right female breast: Secondary | ICD-10-CM | POA: Insufficient documentation

## 2019-06-20 DIAGNOSIS — Z170 Estrogen receptor positive status [ER+]: Secondary | ICD-10-CM | POA: Insufficient documentation

## 2019-06-21 ENCOUNTER — Ambulatory Visit: Payer: 59

## 2019-06-23 NOTE — Progress Notes (Signed)
Patient Care Team: [place], [place] as PCP - General (Family Medicine) Mauro Kaufmann, RN as Oncology Nurse Navigator Rockwell Germany, RN as Oncology Nurse Navigator  DIAGNOSIS:    ICD-10-CM   1. Malignant neoplasm of upper-outer quadrant of right breast in female, estrogen receptor positive ([place])  C50.411    Z17.0     SUMMARY OF ONCOLOGIC HISTORY: Oncology History  Malignant neoplasm of upper-outer quadrant of right breast in female, estrogen receptor positive ([place])  02/19/2019 Initial Diagnosis   Screening detected right breast asymmetry by ultrasound measured 7 mm, biopsy revealed grade 2 IDC ER 95%, PR 40%, Ki-67 5%, HER-2 1+ negative, T1BN0 stage Ia clinical stage   02/28/2019 Cancer Staging   Staging form: Breast, AJCC 8th Edition - Clinical stage from 02/28/2019: Stage IA (cT1b, cN0, cM0, G2, ER+, PR+, HER2-) - Signed by Nicholas Lose, MD on 02/28/2019   03/07/2019 Genetic Testing   VUS in ATM called [id number] was identified on the [facility] Breast Cancer STAT Panel + Common Hereditary Cancers Pane. The STAT Breast cancer panel offered by [facility] includes sequencing and rearrangement analysis for the following 9 genes:  ATM, BRCA1, BRCA2, CDH1, CHEK2, PALB2, PTEN, STK11 and TP53.  The Common Hereditary Cancers Panel offered by [facility] includes sequencing and/or deletion duplication testing of the following 47 genes: APC, ATM, AXIN2, BARD1, BMPR1A, BRCA1, BRCA2, BRIP1, CDH1, CDKN2A (p14ARF), CDKN2A (p16INK4a), CKD4, CHEK2, CTNNA1, DICER1, EPCAM (Deletion/duplication testing only), GREM1 (promoter region deletion/duplication testing only), KIT, MEN1, MLH1, MSH2, MSH3, MSH6, MUTYH, NBN, NF1, NHTL1, PALB2, PDGFRA, PMS2, POLD1, POLE, PTEN, RAD50, RAD51C, RAD51D, SDHB, SDHC, SDHD, SMAD4, SMARCA4. STK11, TP53, TSC1, TSC2, and VHL.  The following genes were evaluated for sequence changes only: SDHA and HOXB13 c.251G>A variant only. The report date is 03/07/2019.   03/15/2019  Surgery   Right lumpectomy: Grade 2 IDC 1.5 cm, margins negative, 0/4 lymph nodes negative, ER 95%, PR 40%, HER-2 1+, negative, Ki-67 5%, T1c M0 stage Ia   03/15/2019 Oncotype testing   Oncotype DX recurrence score 12, distant recurrence at 9 years: 3%   04/11/2019 Cancer Staging   Staging form: Breast, AJCC 8th Edition - Pathologic: Stage IA (pT1c, pN0, cM0, G2, ER+, PR+, HER2-) - Signed by Eppie Gibson, MD on 04/11/2019   05/29/2019 - 06/18/2019 Radiation Therapy   Adjuvant radiation therapy     CHIEF COMPLIANT: Follow-up after radiation to discuss further treatment  INTERVAL HISTORY: Sherry Rivera is a 58 y.o. with above-mentioned history of right breast cancer treated with lumpectomy and who completed radiation therapy on 06/18/19. She presents to the clinic today to discuss further treatment with anti-estrogen therapy.   REVIEW OF SYSTEMS:   Constitutional: Denies fevers, chills or abnormal weight loss Eyes: Denies blurriness of vision Ears, nose, mouth, throat, and face: Denies mucositis or sore throat Respiratory: Denies cough, dyspnea or wheezes Cardiovascular: Denies palpitation, chest discomfort Gastrointestinal: Denies nausea, heartburn or change in bowel habits Skin: Denies abnormal skin rashes Lymphatics: Denies new lymphadenopathy or easy bruising Neurological: Denies numbness, tingling or new weaknesses Behavioral/Psych: Mood is stable, no new changes  Extremities: No lower extremity edema Breast: denies any pain or lumps or nodules in either breasts All other systems were reviewed with the patient and are negative.  I have reviewed the past medical history, past surgical history, social history and family history with the patient and they are unchanged from previous note.  ALLERGIES:  has No Known Allergies.  MEDICATIONS:  Current Outpatient Medications  Medication Sig Dispense Refill  . acetaminophen (TYLENOL) 500 MG tablet Take 1,000 mg by mouth every 6 (six)  hours as needed for headache.    . anastrozole (ARIMIDEX) 1 MG tablet Take 1 tablet (1 mg total) by mouth daily. (Patient not taking: Reported on 04/10/2019) 90 tablet 3  . calcium carbonate (OSCAL) 1500 (600 Ca) MG TABS tablet Take 600 mg of elemental calcium by mouth daily.    Marland Kitchen glucosamine-chondroitin 500-400 MG tablet Take 1 tablet by mouth 3 (three) times daily.    . nicotine polacrilex (COMMIT) 2 MG lozenge Take 2 mg by mouth as needed for smoking cessation.    [name] 3-6-9 Fatty Acids (OMEGA 3-6-9 PO) Take 1 capsule by mouth daily.    Marland Kitchen oxyCODONE (OXY IR/ROXICODONE) 5 MG immediate release tablet Take 1 tablet (5 mg total) by mouth every 6 (six) hours as needed for moderate pain, severe pain or breakthrough pain. (Patient not taking: Reported on 04/10/2019) 10 tablet 0  . Triamcinolone Acetonide (NASACORT ALLERGY 24HR NA) Place into the nose.     No current facility-administered medications for this visit.     PHYSICAL EXAMINATION: ECOG PERFORMANCE STATUS: 1 - Symptomatic but completely ambulatory  Vitals:   06/25/19 1109  BP: 132/70  Pulse: 75  Resp: 18  Temp: 98.3 F (36.8 C)  SpO2: 99%   Filed Weights   06/25/19 1109  Weight: 167 lb 1.6 oz (75.8 kg)    GENERAL: alert, no distress and comfortable SKIN: skin color, texture, turgor are normal, no rashes or significant lesions EYES: normal, Conjunctiva are pink and non-injected, sclera clear OROPHARYNX: no exudate, no erythema and lips, buccal mucosa, and tongue normal  NECK: supple, thyroid normal size, non-tender, without nodularity LYMPH: no palpable lymphadenopathy in the cervical, axillary or inguinal LUNGS: clear to auscultation and percussion with normal breathing effort HEART: regular rate & rhythm and no murmurs and no lower extremity edema ABDOMEN: abdomen soft, non-tender and normal bowel sounds MUSCULOSKELETAL: no cyanosis of digits and no clubbing  NEURO: alert & oriented x 3 with fluent speech, no focal  motor/sensory deficits EXTREMITIES: No lower extremity edema  LABORATORY DATA:  I have reviewed the data as listed CMP Latest Ref Rng & Units 02/28/2019  Glucose 70 - 99 mg/dL 111(H)  BUN 6 - 20 mg/dL 12  Creatinine 0.44 - 1.00 mg/dL 0.74  Sodium 135 - 145 mmol/L 144  Potassium 3.5 - 5.1 mmol/L 4.2  Chloride 98 - 111 mmol/L 109  CO2 22 - 32 mmol/L 25  Calcium 8.9 - 10.3 mg/dL 8.8(L)  Total Protein 6.5 - 8.1 g/dL 6.8  Total Bilirubin 0.3 - 1.2 mg/dL 0.6  Alkaline Phos 38 - 126 U/L 54  AST 15 - 41 U/L 11(L)  ALT 0 - 44 U/L 8    Lab Results  Component Value Date   WBC 6.5 02/28/2019   HGB 13.8 02/28/2019   HCT 42.8 02/28/2019   MCV 94.9 02/28/2019   PLT 235 02/28/2019   NEUTROABS 3.4 02/28/2019    ASSESSMENT & PLAN:  Malignant neoplasm of upper-outer quadrant of right breast in female, estrogen receptor positive ([place]) 02/19/2019: Screening detected right breast asymmetry by ultrasound measured 7 mm, biopsy revealed grade 2 IDC ER 95%, PR 40%, Ki-67 5%, HER-2 1+ negative, T1BN0 stage Ia clinical stage  03/15/2019:Right lumpectomy: Grade 2 IDC 1.5 cm, margins negative, 0/4 lymph nodes negative, ER 95%, PR 40%, HER-2 1+, negative, Ki-67 5%, T1c M0 stage Ia  Oncotype DX recurrence  score 12, distant recurrence at 9 years: 3% Adjuvant radiation therapy: 05/29/2019-06/19/2019  Treatment plan: Adjuvant antiestrogen therapy with anastrozole 1 mg daily x7 years started 03/14/2019 Anastrozole toxicities: 1.  Denies any hot flashes but she has some warmth feeling 2.  Her sleeping was initially disturbed but when she started taking anastrozole at bedtime her sleep has improved  Return to clinic in 3 months for survivorship care plan visit  No orders of the defined types were placed in this encounter.  The patient has a good understanding of the overall plan. she agrees with it. she will call with any problems that may develop before the next visit here.  Nicholas Lose, MD 06/25/2019  Julious Oka Dorshimer am acting as scribe for Dr. Nicholas Lose.  I have reviewed the above documentation for accuracy and completeness, and I agree with the above.

## 2019-06-25 ENCOUNTER — Ambulatory Visit: Payer: 59

## 2019-06-25 ENCOUNTER — Encounter: Payer: Self-pay | Admitting: *Deleted

## 2019-06-25 ENCOUNTER — Other Ambulatory Visit: Payer: Self-pay

## 2019-06-25 ENCOUNTER — Inpatient Hospital Stay: Payer: 59 | Attending: Hematology and Oncology | Admitting: Hematology and Oncology

## 2019-06-25 DIAGNOSIS — Z79811 Long term (current) use of aromatase inhibitors: Secondary | ICD-10-CM

## 2019-06-25 DIAGNOSIS — Z923 Personal history of irradiation: Secondary | ICD-10-CM | POA: Diagnosis not present

## 2019-06-25 DIAGNOSIS — Z79899 Other long term (current) drug therapy: Secondary | ICD-10-CM | POA: Diagnosis not present

## 2019-06-25 DIAGNOSIS — C50411 Malignant neoplasm of upper-outer quadrant of right female breast: Secondary | ICD-10-CM

## 2019-06-25 DIAGNOSIS — Z170 Estrogen receptor positive status [ER+]: Secondary | ICD-10-CM

## 2019-06-26 ENCOUNTER — Telehealth: Payer: Self-pay | Admitting: Hematology and Oncology

## 2019-06-26 ENCOUNTER — Ambulatory Visit: Payer: 59

## 2019-06-26 NOTE — Telephone Encounter (Signed)
I talk with patient regarding schedule  

## 2019-07-19 NOTE — Progress Notes (Signed)
I called the patient today about her upcoming follow-up appointment in radiation oncology.   Given concerns about the COVID-19 pandemic, I offered a phone assessment with the patient to determine if coming to the clinic was necessary. She accepted.  I let the patient know that I had spoken with Dr. Isidore Moos, and she wanted them to know the importance of washing their hands for at least 20 seconds at a time, especially after going out in public, and before they eat.  Limit going out in public whenever possible. Do not touch your face, unless your hands are clean, such as when bathing. Get plenty of rest, eat well, and stay hydrated.   The patient denies any symptomatic concerns.  Specifically, they report good healing of their skin in the radiation fields.  Skin is intact.  She reports she is healing well from radiation, and denies any concerns at this time. She is using a vitamin E lotion to her radiation site as directed.   I recommended that she continue skin care by applying oil or lotion with vitamin E to the skin in the radiation fields, BID, for 2 more months.  Continue follow-up with medical oncology - follow-up is scheduled on 10/05/19 for survivorship with Wilber Bihari NP.  I explained that yearly mammograms are important for patients with intact breast tissue, and physical exams are important after mastectomy for patients that cannot undergo mammography.  I encouraged her to call if she had further questions or concerns about her healing. Otherwise, she will follow-up PRN in radiation oncology. Patient is pleased with this plan, and we will cancel her upcoming follow-up to reduce the risk of COVID-19 transmission.

## 2019-07-20 ENCOUNTER — Ambulatory Visit
Admission: RE | Admit: 2019-07-20 | Discharge: 2019-07-20 | Disposition: A | Discharge status: Home / Self Care | Payer: 59 | Source: Ambulatory Visit | Attending: Radiation Oncology | Admitting: Radiation Oncology

## 2019-08-02 NOTE — Progress Notes (Signed)
  Patient Name: Sherry Rivera MRN: [id number] DOB: 1961-03-12 Referring Physician: Nicholas Lose (Profile Not Attached) Date of Service: 06/18/2019 [facility], [place]                                                        End Of Treatment Note  Diagnoses: C50.411-Malignant neoplasm of upper-outer quadrant of right female breast  Cancer Staging Malignant neoplasm of upper-outer quadrant of right breast in female, estrogen receptor positive ([place]) Staging form: Breast, AJCC 8th Edition - Clinical stage from 02/28/2019: Stage IA (cT1b, cN0, cM0, G2, ER+, PR+, HER2-) - Signed by Nicholas Lose, MD on 02/28/2019 - Pathologic: Stage IA (pT1c, pN0, cM0, G2, ER+, PR+, HER2-) - Signed by Eppie Gibson, MD on 04/11/2019  Intent: Curative  Radiation Treatment Dates: 05/28/2019 through 06/18/2019 Site Technique Total Dose (Gy) Dose per Fx Completed Fx Beam Energies  Breast: Breast_Rt 3D 42.56/42.56 2.66 16/16 6X, 10X   Narrative: The patient tolerated radiation therapy relatively well. She experienced mild fatigue and some expected skin irritation as she progressed through treatment. She developed erythema to the right breast; skin remained intact. She is using her skin creams as ordered.  Plan: The patient will follow-up with radiation oncology in one month. Skin care to continue as discussed.   ________________________________________________  Eppie Gibson, MD  This document serves as a record of services personally performed by Eppie Gibson, MD. It was created on her behalf by Rae Lips, a trained medical scribe. The creation of this record is based on the scribe's personal observations and the provider's statements to them. This document has been checked and approved by the attending provider.

## 2019-09-12 ENCOUNTER — Telehealth: Payer: Self-pay | Admitting: Adult Health

## 2019-09-12 NOTE — Telephone Encounter (Signed)
I talk with patient regarding video visit °

## 2019-10-05 ENCOUNTER — Inpatient Hospital Stay: Payer: 59 | Attending: Adult Health | Admitting: Adult Health

## 2019-10-05 ENCOUNTER — Encounter: Payer: Self-pay | Admitting: Adult Health

## 2019-10-05 DIAGNOSIS — Z87891 Personal history of nicotine dependence: Secondary | ICD-10-CM

## 2019-10-05 DIAGNOSIS — Z170 Estrogen receptor positive status [ER+]: Secondary | ICD-10-CM | POA: Diagnosis not present

## 2019-10-05 DIAGNOSIS — C50411 Malignant neoplasm of upper-outer quadrant of right female breast: Secondary | ICD-10-CM | POA: Diagnosis not present

## 2019-10-05 DIAGNOSIS — E2839 Other primary ovarian failure: Secondary | ICD-10-CM | POA: Diagnosis not present

## 2019-10-05 NOTE — Progress Notes (Signed)
SURVIVORSHIP VIRTUAL VISIT:  I connected with Easter Kennebrew on 10/05/19 at 10:00 AM EDT by my chart video and verified that I am speaking with the correct person using two identifiers.  I discussed the limitations, risks, security and privacy concerns of performing an evaluation and management service via my chart video and the availability of in person appointments. I also discussed with the patient that there may be a patient responsible charge related to this service. The patient expressed understanding and agreed to proceed.   BRIEF ONCOLOGIC HISTORY:  Oncology History  Malignant neoplasm of upper-outer quadrant of right breast in female, estrogen receptor positive ([place])  02/19/2019 Initial Diagnosis   Screening detected right breast asymmetry by ultrasound measured 7 mm, biopsy revealed grade 2 IDC ER 95%, PR 40%, Ki-67 5%, HER-2 1+ negative, T1BN0 stage Ia clinical stage   02/28/2019 Cancer Staging   Staging form: Breast, AJCC 8th Edition - Clinical stage from 02/28/2019: Stage IA (cT1b, cN0, cM0, G2, ER+, PR+, HER2-) - Signed by Nicholas Lose, MD on 02/28/2019   03/07/2019 Genetic Testing   VUS in ATM called c.7919C>T was identified on the [facility] Breast Cancer STAT Panel + Common Hereditary Cancers Pane. The STAT Breast cancer panel offered by [facility] includes sequencing and rearrangement analysis for the following 9 genes:  ATM, BRCA1, BRCA2, CDH1, CHEK2, PALB2, PTEN, STK11 and TP53.  The Common Hereditary Cancers Panel offered by [facility] includes sequencing and/or deletion duplication testing of the following 47 genes: APC, ATM, AXIN2, BARD1, BMPR1A, BRCA1, BRCA2, BRIP1, CDH1, CDKN2A (p14ARF), CDKN2A (p16INK4a), CKD4, CHEK2, CTNNA1, DICER1, EPCAM (Deletion/duplication testing only), GREM1 (promoter region deletion/duplication testing only), KIT, MEN1, MLH1, MSH2, MSH3, MSH6, MUTYH, NBN, NF1, NHTL1, PALB2, PDGFRA, PMS2, POLD1, POLE, PTEN, RAD50, RAD51C, RAD51D, SDHB, SDHC, SDHD, SMAD4, SMARCA4.  STK11, TP53, TSC1, TSC2, and VHL.  The following genes were evaluated for sequence changes only: SDHA and HOXB13 c.251G>A variant only. The report date is 03/07/2019.   03/15/2019 Surgery   Right lumpectomy: Grade 2 IDC 1.5 cm, margins negative, 0/4 lymph nodes negative, ER 95%, PR 40%, HER-2 1+, negative, Ki-67 5%, T1c M0 stage Ia   03/15/2019 Oncotype testing   Oncotype DX recurrence score 12, distant recurrence at 9 years: 3%   04/11/2019 Cancer Staging   Staging form: Breast, AJCC 8th Edition - Pathologic: Stage IA (pT1c, pN0, cM0, G2, ER+, PR+, HER2-) - Signed by Eppie Gibson, MD on 04/11/2019   05/29/2019 - 06/18/2019 Radiation Therapy   Adjuvant radiation therapy   06/2019 -  Anti-estrogen oral therapy   Anastrozole daily     INTERVAL HISTORY:  Ms. Bancroft to review her survivorship care plan detailing her treatment course for breast cancer, as well as monitoring long-term side effects of that treatment, education regarding health maintenance, screening, and overall wellness and health promotion.     Overall, Ms. Kuipers reports feeling quite well.  She notes that she is having some wrist pain, from overextension.  She notes a pulling sensation underneath her right arm.  This has been going on since April or May 2020 and is slowly improving.  She says it is only noticeable when she is getting something out of the cabinets.  She is taking Anastrozole daily and is tolerating it well.  She denies any hot flashes, vaginal dryness, or arthralgias.    Verdis notes that about a week ago, she noted her left breast was itching.  She then noted a thick white nipple discharge, and it was not sticky and had  no smell.  She notes that the nipple is hard and enlarged.  She wants to know what to do about.  There is no redness at the breast or swelling.    REVIEW OF SYSTEMS:  Review of Systems  Constitutional: Negative for appetite change, chills, fatigue, fever and unexpected weight change.  HENT:    Negative for hearing loss and lump/mass.   Eyes: Negative for eye problems and icterus.  Respiratory: Negative for chest tightness, cough and shortness of breath.   Cardiovascular: Negative for chest pain, leg swelling and palpitations.  Gastrointestinal: Negative for abdominal distention, abdominal pain, constipation, diarrhea, nausea and vomiting.  Endocrine: Negative for hot flashes.  Musculoskeletal: Negative for arthralgias.  Skin: Negative for itching and rash.  Neurological: Negative for dizziness, extremity weakness, headaches and numbness.  Hematological: Negative for adenopathy. Does not bruise/bleed easily.  Psychiatric/Behavioral: Negative for depression. The patient is not nervous/anxious.       ONCOLOGY TREATMENT TEAM:  1. Surgeon:  Dr. Donne Hazel at [facility] 2. Medical Oncologist: Dr. Lindi Adie  3. Radiation Oncologist: Dr. Isidore Moos    PAST MEDICAL/SURGICAL HISTORY:  Past Medical History:  Diagnosis Date  . 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   . Arthritis    fingers  . Cancer ([place]) 02/2019   right breast IDC  . Family history of breast cancer   . Family history of colon cancer   . Family history of prostate cancer   . Medical history non-contributory   . Seasonal allergies    Past Surgical History:  Procedure Laterality Date  . BREAST LUMPECTOMY WITH RADIOACTIVE SEED AND SENTINEL LYMPH NODE BIOPSY Right 03/15/2019   Procedure: RIGHT BREAST LUMPECTOMY WITH RADIOACTIVE SEED AND RIGHT AXILLARY SENTINEL LYMPH NODE BIOPSY;  Surgeon: Rolm Bookbinder, MD;  Location: [place];  Service: General;  Laterality: Right;  . COLONOSCOPY N/A 02/10/2016   Procedure: COLONOSCOPY;  Surgeon: Aviva Signs, MD;  [facility];  Service: Gastroenterology;  Laterality: N/A;  . laser surgery on cervix    . TONSILLECTOMY    . tumor on tongue removed  2000     ALLERGIES:  No  Known Allergies   CURRENT MEDICATIONS:  Outpatient Encounter Medications as of 10/05/2019  Medication Sig  . acetaminophen (TYLENOL) 500 MG tablet Take 1,000 mg by mouth every 6 (six) hours as needed for headache.  . anastrozole (ARIMIDEX) 1 MG tablet Take 1 tablet (1 mg total) by mouth daily. (Patient not taking: Reported on 04/10/2019)  . calcium carbonate (OSCAL) 1500 (600 Ca) MG TABS tablet Take 600 mg of elemental calcium by mouth daily.  Marland Kitchen glucosamine-chondroitin 500-400 MG tablet Take 1 tablet by mouth 3 (three) times daily.  . nicotine polacrilex (COMMIT) 2 MG lozenge Take 2 mg by mouth as needed for smoking cessation.  [name] 3-6-9 Fatty Acids (OMEGA 3-6-9 PO) Take 1 capsule by mouth daily.  . Triamcinolone Acetonide (NASACORT ALLERGY 24HR NA) Place into the nose.   No facility-administered encounter medications on file as of 10/05/2019.      ONCOLOGIC FAMILY HISTORY:  Family History  Problem Relation Age of Onset  . Colon cancer Other   . Breast cancer Mother   . Bladder Cancer Father   . Prostate cancer Maternal Grandfather   . Colon cancer Paternal Grandmother   . Breast cancer Paternal Aunt   . Breast cancer Cousin      GENETIC COUNSELING/TESTING: negative  SOCIAL HISTORY:  Social History   Socioeconomic History  . Marital status: Married    Spouse name: Not on file  . Number of children: Not on file  . Years of education: Not on file  . Highest education level: Not on file  Occupational History  . Not on file  Social Needs  . Financial resource strain: Not on file  . Food insecurity    Worry: Not on file    Inability: Not on file  . Transportation needs    Medical: No    Non-medical: No  Tobacco Use  . Smoking status: Former Smoker    Packs/day: 1.00    Years: 36.00    Pack years: 36.00    Types: Cigarettes    Quit date: 02/18/2019    Years since quitting: 0.6  . Smokeless tobacco: Never Used  Substance and Sexual Activity  . Alcohol use: Not  Currently    Comment: Rarely  . Drug use: No  . Sexual activity: Not on file  Lifestyle  . Physical activity    Days per week: Not on file    Minutes per session: Not on file  . Stress: Not on file  Relationships  . Social Herbalist on phone: Not on file    Gets together: Not on file    Attends religious service: Not on file    Active member of club or organization: Not on file    Attends meetings of clubs or organizations: Not on file    Relationship status: Not on file  . Intimate partner violence    Fear of current or ex partner: No    Emotionally abused: No    Physically abused: No    Forced sexual activity: No  Other Topics Concern  . Not on file  Social History Narrative  . Not on file     OBSERVATIONS/OBJECTIVE:  Patient appears well.  In no apparent distress.  Mood and behavior are normal.  Skin visualized was normal.  Breathing non labored.  LABORATORY DATA:  None for this visit.  DIAGNOSTIC IMAGING:  None for this visit.      ASSESSMENT AND PLAN:  Ms.. Jiron is a pleasant 58 y.o. female with Stage IA right breast invasive ductal carcinoma, ER+/PR+/HER2-, diagnosed in 02/2019, treated with lumpectomy, adjuvant radiation therapy, and anti-estrogen therapy with Anastrozole beginning in 04/2019.  She presents to the Survivorship Clinic for our initial meeting and routine follow-up post-completion of treatment for breast cancer.    1. Stage IA right breast cancer:  Ms. Hird is continuing to recover from definitive treatment for breast cancer. She will follow-up with her medical oncologist, Dr. Lindi Adie 03/2020 with history and physical exam per surveillance protocol.  She will continue her anti-estrogen therapy with Anastrozole. Thus far, she is tolerating the Anastrozole well, with minimal side effects. She was instructed to make Dr. Lindi Adie or myself aware if she begins to experience any worsening side effects of the medication and I could see her back in  clinic to help manage those side effects, as needed. Her mammogram is due 01/2020; orders placed today. Today, a comprehensive survivorship care plan and treatment summary was reviewed with the patient today detailing her breast cancer diagnosis, treatment course, potential late/long-term effects of treatment, appropriate follow-up care with recommendations for the future, and patient education resources.  A copy of this summary, along with a letter will be sent to the patient's primary care provider via mail/fax/In Basket message after  today's visit.    2. Former Heavy Smoker: She has a 35 pack year smoking history.  I applauded her on quitting smoking in 02/2019.  She is eligible for lung cancer screening and I placed orders for this today.    3. Bone health:  Given Ms. Strycharz's age/history of breast cancer and her current treatment regimen including anti-estrogen therapy with Anastrozole, she is at risk for bone demineralization.  She has never undergone bone density testing, so I placed orders for her to undergo one in 01/2020.  In the meantime, she was encouraged to increase her consumption of foods rich in calcium, as well as increase her weight-bearing activities.  She was given education on specific activities to promote bone health.  4. Cancer screening:  Due to Ms. Meland's history and her age, she should receive screening for skin cancers, colon cancer, and gynecologic cancers.  The information and recommendations are listed on the patient's comprehensive care plan/treatment summary and were reviewed in detail with the patient.    5. Health maintenance and wellness promotion: Ms. Schuchard was encouraged to consume 5-7 servings of fruits and vegetables per day. We reviewed the "Nutrition Rainbow" handout, as well as the handout "Take Control of Your Health and Reduce Your Cancer Risk" from the [place].  She was also encouraged to engage in moderate to vigorous exercise for 30 minutes per  day most days of the week. We discussed the [employer] fitness program, which is designed for cancer survivors to help them become more physically fit after cancer treatments.  She was instructed to limit her alcohol consumption and continue to abstain from tobacco use.     6. Support services/counseling: It is not uncommon for this period of the patient's cancer care trajectory to be one of many emotions and stressors.  We discussed how this can be increasingly difficult during the times of quarantine and social distancing due to the COVID-19 pandemic.   She was given information regarding our available services and encouraged to contact me with any questions or for help enrolling in any of our support group/programs.   7. Left Breast nipple discharge and enlargement: I placed orders for a left breast diagnostic mammogram and ultrasound to be done at [facility].  She also sees Dr. Donne Hazel next week.     Follow up instructions:    -Return to cancer center in 03/2020 -Mammogram due in 01/2020 (annual) -Left Breast mammogram and ultrasound next available -Follow up with surgery 09/2019 -DEXA in 01/2020 -CT lung cancer screening 09/2019 -She is welcome to return back to the Survivorship Clinic at any time; no additional follow-up needed at this time.  -Consider referral back to survivorship as a long-term survivor for continued surveillance  The patient was provided an opportunity to ask questions and all were answered. The patient agreed with the plan and demonstrated an understanding of the instructions.   The patient was advised to call back or seek an in-person evaluation if the symptoms worsen or if the condition fails to improve as anticipated.   I provided 25 minutes of face-to-face video visit time during this encounter, and > 50% was spent counseling as documented under my assessment & plan.  Scot Dock, NP

## 2019-10-10 ENCOUNTER — Telehealth: Payer: Self-pay | Admitting: Hematology and Oncology

## 2019-10-10 NOTE — Telephone Encounter (Signed)
I talk with patient regarding schedule  

## 2019-10-19 ENCOUNTER — Ambulatory Visit (HOSPITAL_COMMUNITY)
Admission: RE | Admit: 2019-10-19 | Discharge: 2019-10-19 | Disposition: A | Discharge status: Home / Self Care | Payer: 59 | Source: Ambulatory Visit | Attending: Adult Health | Admitting: Adult Health

## 2019-10-19 ENCOUNTER — Other Ambulatory Visit: Payer: Self-pay

## 2019-10-19 DIAGNOSIS — Z87891 Personal history of nicotine dependence: Secondary | ICD-10-CM | POA: Insufficient documentation

## 2019-10-22 ENCOUNTER — Telehealth: Payer: Self-pay | Admitting: *Deleted

## 2019-10-22 ENCOUNTER — Encounter: Payer: Self-pay | Admitting: Adult Health

## 2019-10-22 NOTE — Telephone Encounter (Signed)
-----   Message from Gardenia Phlegm, NP sent at 10/22/2019  2:45 PM EST ----- CT normal.  Recommend repeat in one year.  Please notify patient. ----- Message ----- From: Interface, Rad Results In Sent: 10/19/2019   1:10 PM EST To: Gardenia Phlegm, NP

## 2019-10-22 NOTE — Telephone Encounter (Signed)
Per Wilber Bihari NP, called pt regarding normal CT scan. Pt verbalized understanding and advised if there were any other concerns to call office.

## 2019-11-27 ENCOUNTER — Encounter: Payer: Self-pay | Admitting: *Deleted

## 2020-02-10 IMAGING — US US AORTA
1 series · 14 of 17 positions shown · non-contrast
Comparison: None.

CLINICAL DATA: Abdominal bruit.  Smoker.  Previous stroke.

EXAM:
ULTRASOUND OF ABDOMINAL AORTA
TECHNIQUE: Ultrasound examination of the abdominal aorta was performed to
evaluate for abdominal aortic aneurysm.

[Series 1: us aorta · 14 of 17 slices shown]
[im 1/17]
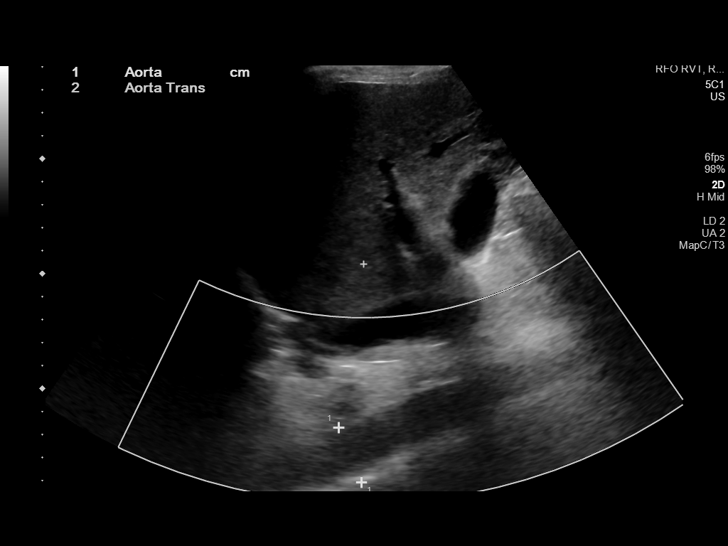
[im 2/17]
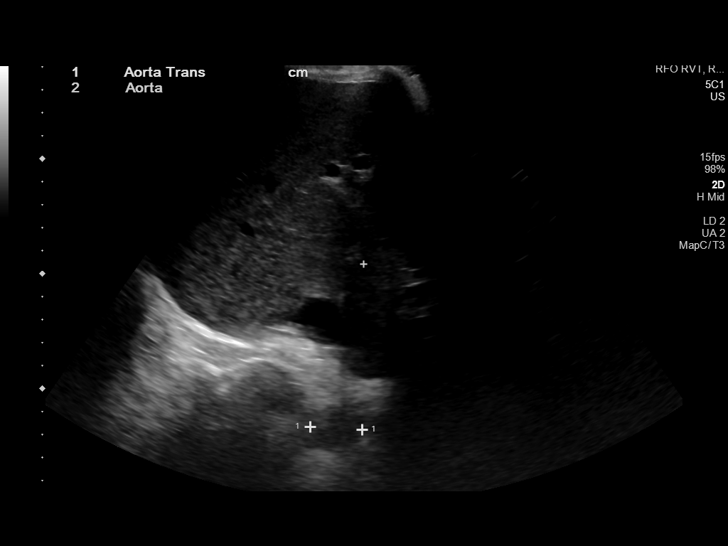
[im 4/17]
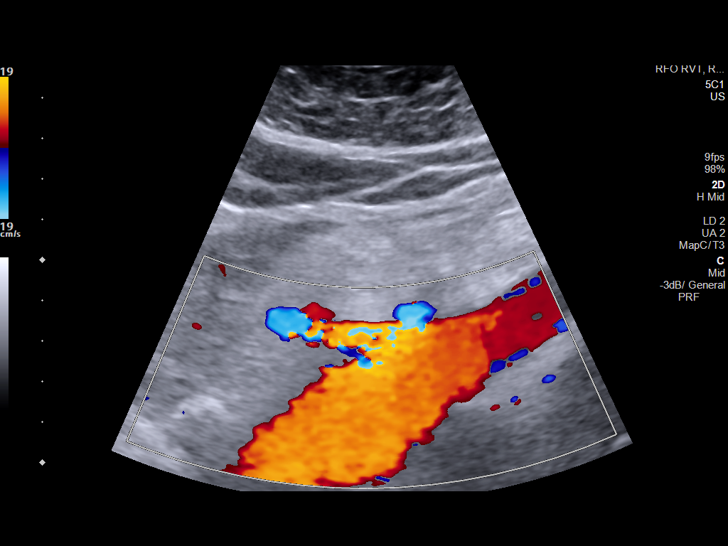
[im 5/17]
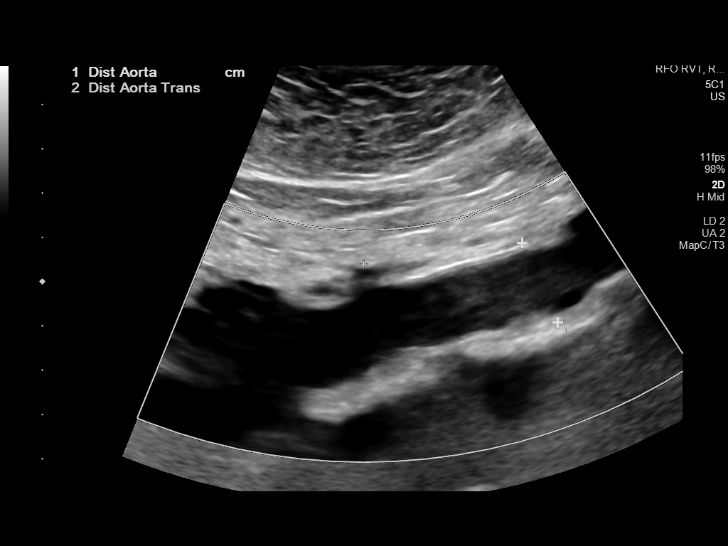
[im 6/17]
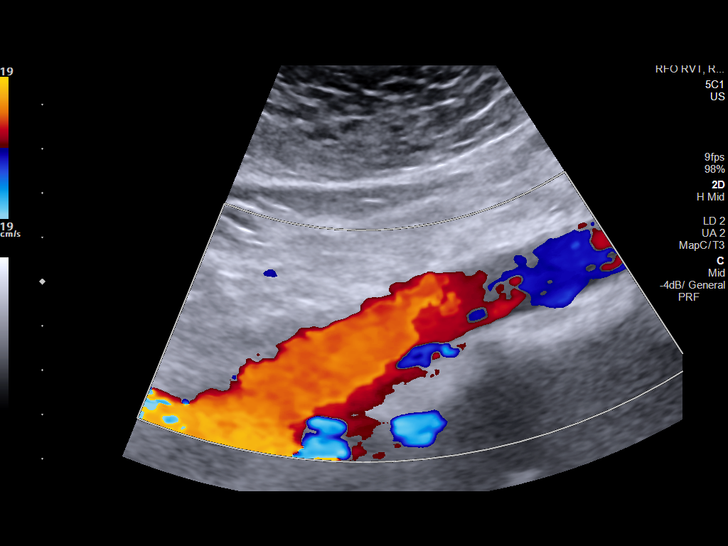
[im 7/17]
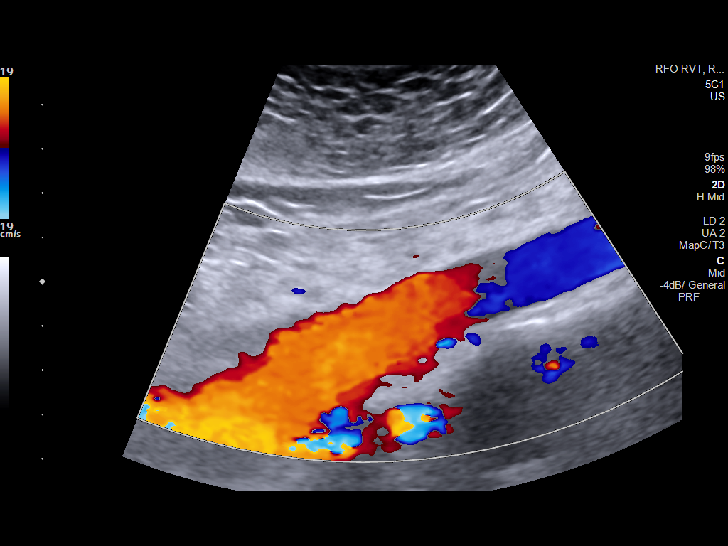
[im 8/17]
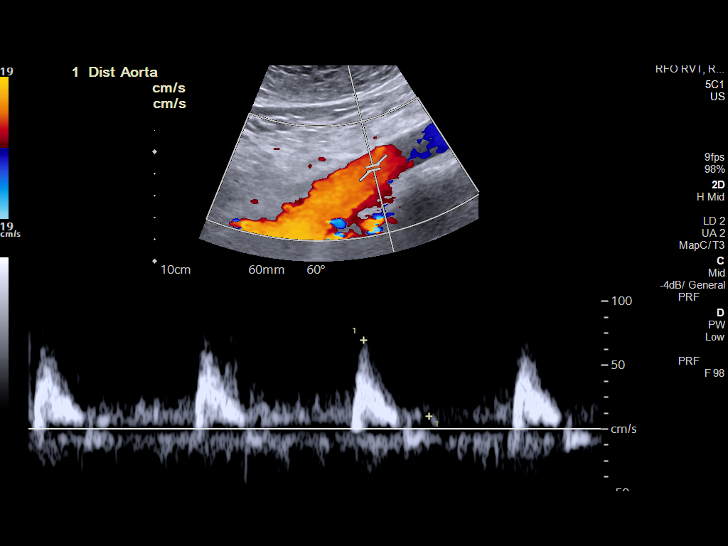
[im 10/17]
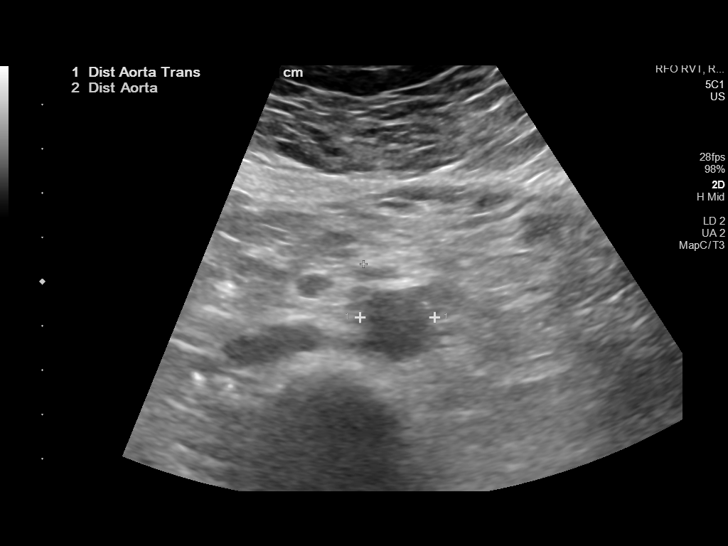
[im 11/17]
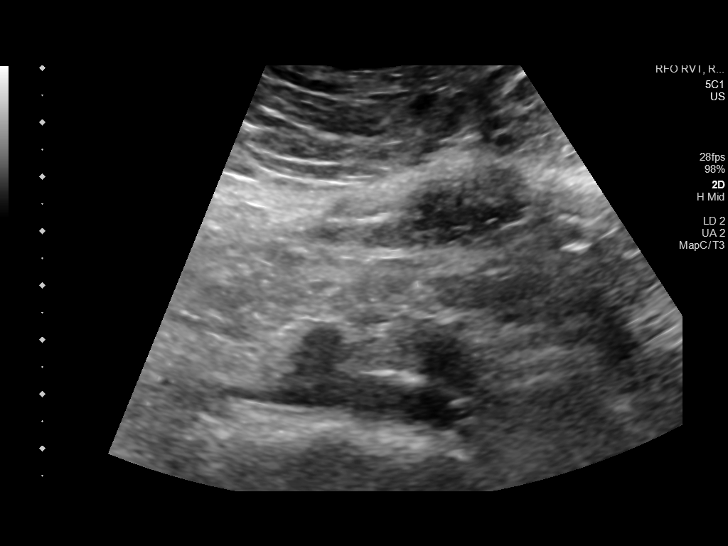
[im 12/17]
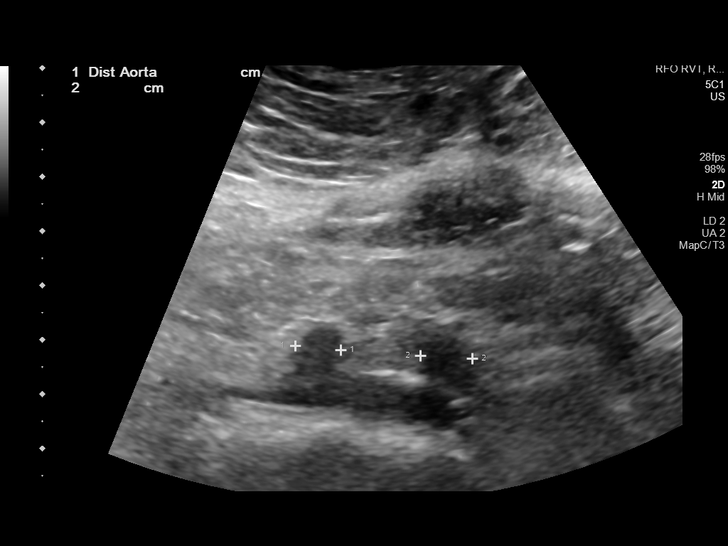
[im 13/17]
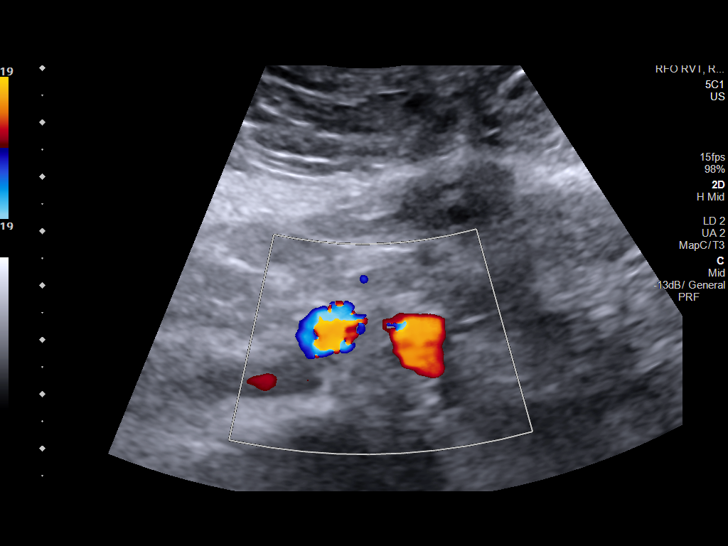
[im 14/17]
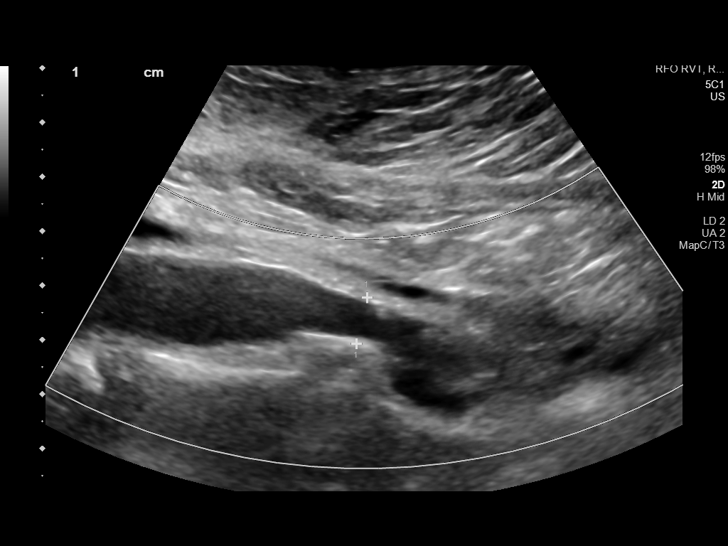
[im 16/17]
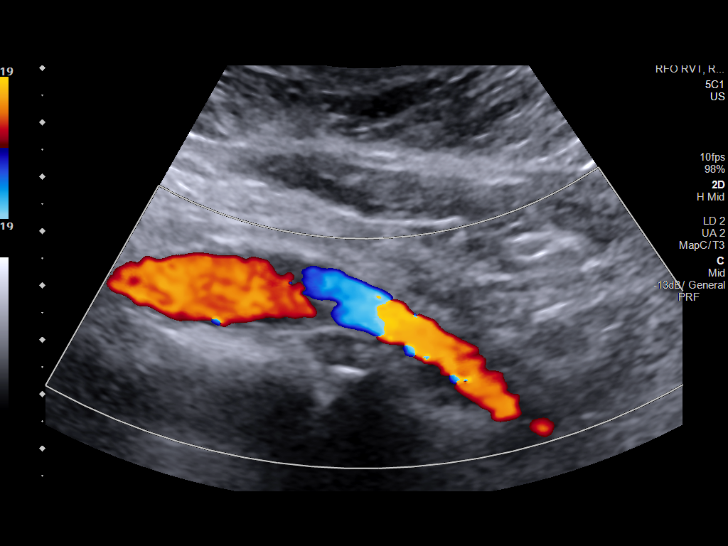
[im 17/17]
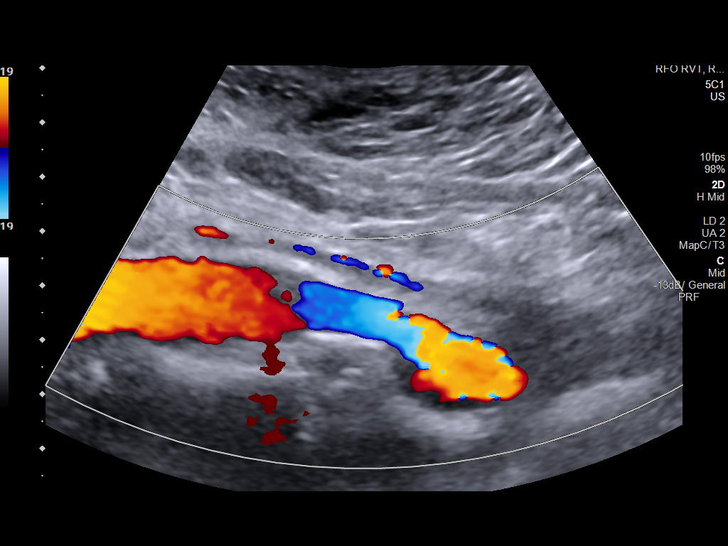

[14 of 17 positions shown; findings below may reference images not displayed]

FINDINGS: Abdominal aortic measurements as follows:

Proximal:  2.6 cm

Mid:  2.0 cm

Distal:  1.8 cm

Aortic atherosclerotic plaque noted. No evidence of dilatation of
common iliac arteries.
IMPRESSION: No evidence of abdominal aortic aneurysm. Aortic atherosclerotic
plaque noted.

## 2020-02-25 ENCOUNTER — Other Ambulatory Visit: Payer: Self-pay | Admitting: Hematology and Oncology

## 2020-04-09 NOTE — Progress Notes (Signed)
Patient Care Team: [facility], [place] as PCP - General (Family Medicine) Sherry Gibson, MD as Attending Physician (Radiation Oncology) Sherry Lose, MD as Consulting Physician (Hematology and Oncology) Sherry Bookbinder, MD as Consulting Physician (General Surgery)  DIAGNOSIS:    ICD-10-CM   1. Malignant neoplasm of upper-outer quadrant of right breast in female, estrogen receptor positive (Sherry Rivera)  C50.411    Z17.0     SUMMARY OF ONCOLOGIC HISTORY: Oncology History  Malignant neoplasm of upper-outer quadrant of right breast in female, estrogen receptor positive (Sherry Rivera)  02/19/2019 Initial Diagnosis   Screening detected right breast asymmetry by ultrasound measured 7 mm, biopsy revealed grade 2 IDC ER 95%, PR 40%, Ki-67 5%, HER-2 1+ negative, T1BN0 stage Ia clinical stage   02/28/2019 Cancer Staging   Staging form: Breast, AJCC 8th Edition - Clinical stage from 02/28/2019: Stage IA (cT1b, cN0, cM0, G2, ER+, PR+, HER2-) - Signed by Sherry Lose, MD on 02/28/2019   03/07/2019 Genetic Testing   VUS in ATM called c.7919C>T was identified on the [facility] Breast Cancer STAT Panel + Common Hereditary Cancers Pane. The STAT Breast cancer panel offered by [facility] includes sequencing and rearrangement analysis for the following 9 genes:  ATM, BRCA1, BRCA2, CDH1, CHEK2, PALB2, PTEN, STK11 and TP53.  The Common Hereditary Cancers Panel offered by [facility] includes sequencing and/or deletion duplication testing of the following 47 genes: APC, ATM, AXIN2, BARD1, BMPR1A, BRCA1, BRCA2, BRIP1, CDH1, CDKN2A (p14ARF), CDKN2A (p16INK4a), CKD4, CHEK2, CTNNA1, DICER1, EPCAM (Deletion/duplication testing only), GREM1 (promoter region deletion/duplication testing only), KIT, MEN1, MLH1, MSH2, MSH3, MSH6, MUTYH, NBN, NF1, NHTL1, PALB2, PDGFRA, PMS2, POLD1, POLE, PTEN, RAD50, RAD51C, RAD51D, SDHB, SDHC, SDHD, SMAD4, SMARCA4. STK11, TP53, TSC1, TSC2, and VHL.  The following genes were evaluated for sequence  changes only: SDHA and HOXB13 c.251G>A variant only. The report date is 03/07/2019.   03/15/2019 Surgery   Right lumpectomy: Grade 2 IDC 1.5 cm, margins negative, 0/4 lymph nodes negative, ER 95%, PR 40%, HER-2 1+, negative, Ki-67 5%, T1c M0 stage Ia   03/15/2019 Oncotype testing   Oncotype DX recurrence score 12, distant recurrence at 9 years: 3%   04/11/2019 Cancer Staging   Staging form: Breast, AJCC 8th Edition - Pathologic: Stage IA (pT1c, pN0, cM0, G2, ER+, PR+, HER2-) - Signed by Sherry Gibson, MD on 04/11/2019   04/2019 -  Anti-estrogen oral therapy   Anastrozole daily   05/29/2019 - 06/18/2019 Radiation Therapy   Adjuvant radiation therapy     CHIEF COMPLIANT: Follow-up of right breast cancer on anastrozole  INTERVAL HISTORY: Sherry Rivera is a 59 y.o. with above-mentioned history of right breast cancer treated with lumpectomy, radiation, and who is currently on anti-estrogen therapy with anastrozole. Patient reported white discharge from her left nipple. Mammogram on 10/16/19 showed no breast abnormalities. Mammogram on 02/06/20 showed no evidence of malignancy. She presents to the clinic today for follow-up.   ALLERGIES:  has No Known Allergies.  MEDICATIONS:  Current Outpatient Medications  Medication Sig Dispense Refill  . acetaminophen (TYLENOL) 500 MG tablet Take 1,000 mg by mouth every 6 (six) hours as needed for headache.    . anastrozole (ARIMIDEX) 1 MG tablet TAKE 1 TABLET BY MOUTH EVERY DAY 90 tablet 0  . calcium carbonate (OSCAL) 1500 (600 Ca) MG TABS tablet Take 600 mg of elemental calcium by mouth daily.    Marland Kitchen glucosamine-chondroitin 500-400 MG tablet Take 1 tablet by mouth 3 (three) times daily.    . nicotine polacrilex (COMMIT) 2  MG lozenge Take 2 mg by mouth as needed for smoking cessation.    Sherry Rivera 3-6-9 Fatty Acids (OMEGA 3-6-9 PO) Take 1 capsule by mouth daily.    . Triamcinolone Acetonide (NASACORT ALLERGY 24HR NA) Place into the nose.     No current  facility-administered medications for this visit.    PHYSICAL EXAMINATION: ECOG PERFORMANCE STATUS: 0 - Asymptomatic  Vitals:   04/10/20 0957  BP: 138/85  Pulse: 77  Resp: 18  Temp: 98.2 F (36.8 C)  SpO2: 100%   Filed Weights   04/10/20 0957  Weight: 172 lb 8 oz (78.2 kg)    BREAST: No palpable masses or nodules in either right or left breasts. No palpable axillary supraclavicular or infraclavicular adenopathy no breast tenderness or nipple discharge. (exam performed in the presence of a chaperone)  LABORATORY DATA:  I have reviewed the data as listed CMP Latest Ref Rng & Units 02/28/2019  Glucose 70 - 99 mg/dL 111(H)  BUN 6 - 20 mg/dL 12  Creatinine 0.44 - 1.00 mg/dL 0.74  Sodium 135 - 145 mmol/L 144  Potassium 3.5 - 5.1 mmol/L 4.2  Chloride 98 - 111 mmol/L 109  CO2 22 - 32 mmol/L 25  Calcium 8.9 - 10.3 mg/dL 8.8(L)  Total Protein 6.5 - 8.1 g/dL 6.8  Total Bilirubin 0.3 - 1.2 mg/dL 0.6  Alkaline Phos 38 - 126 U/L 54  AST 15 - 41 U/L 11(L)  ALT 0 - 44 U/L 8    Lab Results  Component Value Date   WBC 6.5 02/28/2019   HGB 13.8 02/28/2019   HCT 42.8 02/28/2019   MCV 94.9 02/28/2019   PLT 235 02/28/2019   NEUTROABS 3.4 02/28/2019    ASSESSMENT & PLAN:  Malignant neoplasm of upper-outer quadrant of right breast in female, estrogen receptor positive ([name]) 02/19/2019: Screening detected right breast asymmetry by ultrasound measured 7 mm, biopsy revealed grade 2 IDC ER 95%, PR 40%, Ki-67 5%, HER-2 1+ negative, T1BN0 stage Ia clinical stage  03/15/2019:Right lumpectomy: Grade 2 IDC 1.5 cm, margins negative, 0/4 lymph nodes negative, ER 95%, PR 40%, HER-2 1+, negative, Ki-67 5%, T1c M0 stage Ia  Oncotype DX recurrence score 12, distant recurrence at 9 years: 3% Adjuvant radiation therapy: 05/29/2019-06/19/2019  Treatment plan: Adjuvant antiestrogen therapy with anastrozole 1 mg daily x7 years started 03/14/2019 Anastrozole toxicities: Denies any hot flashes or  arthralgias or myalgias.  Breast cancer surveillance: 1.  Breast exam 04/10/2020: Benign 2.  Mammogram 02/06/2020: Sherry Rivera, benign, breast density category B  Return to clinic in 1 year for follow-up    No orders of the defined types were placed in this encounter.  The patient has a good understanding of the overall plan. she agrees with it. she will call with any problems that may develop before the next visit here.  Total time spent: 20 mins including face to face time and time spent for planning, charting and coordination of care  Sherry Lose, MD 04/10/2020  I, Cloyde Reams Dorshimer, am acting as scribe for Dr. Nicholas Rivera.  I have reviewed the above documentation for accuracy and completeness, and I agree with the above.

## 2020-04-10 ENCOUNTER — Inpatient Hospital Stay: Payer: 59 | Attending: Hematology and Oncology | Admitting: Hematology and Oncology

## 2020-04-10 ENCOUNTER — Other Ambulatory Visit: Payer: Self-pay

## 2020-04-10 DIAGNOSIS — Z79811 Long term (current) use of aromatase inhibitors: Secondary | ICD-10-CM | POA: Diagnosis not present

## 2020-04-10 DIAGNOSIS — Z923 Personal history of irradiation: Secondary | ICD-10-CM | POA: Insufficient documentation

## 2020-04-10 DIAGNOSIS — C50411 Malignant neoplasm of upper-outer quadrant of right female breast: Secondary | ICD-10-CM | POA: Insufficient documentation

## 2020-04-10 DIAGNOSIS — Z170 Estrogen receptor positive status [ER+]: Secondary | ICD-10-CM | POA: Insufficient documentation

## 2020-04-10 MED ORDER — ANASTROZOLE 1 MG PO TABS: 1.0000 mg | ORAL_TABLET | Freq: Every day | ORAL | 3 refills | Status: DC

## 2020-04-10 NOTE — Assessment & Plan Note (Signed)
02/19/2019: Screening detected right breast asymmetry by ultrasound measured 7 mm, biopsy revealed grade 2 IDC ER 95%, PR 40%, Ki-67 5%, HER-2 1+ negative, T1BN0 stage Ia clinical stage  03/15/2019:Right lumpectomy: Grade 2 IDC 1.5 cm, margins negative, 0/4 lymph nodes negative, ER 95%, PR 40%, HER-2 1+, negative, Ki-67 5%, T1c M0 stage Ia  Oncotype DX recurrence score 12, distant recurrence at 9 years: 3% Adjuvant radiation therapy: 05/29/2019-06/19/2019  Treatment plan: Adjuvant antiestrogen therapy with anastrozole 1 mg daily x7 years started 03/14/2019 Anastrozole toxicities: 1.  Denies any hot flashes but she has some warmth feeling 2.  Her sleeping was initially disturbed but when she started taking anastrozole at bedtime her sleep has improved  Breast cancer surveillance: 1.  Breast exam 04/10/2020: Benign 2.  Mammogram 02/06/2020: [name], benign, breast density category B  Return to clinic in 1 year for follow-up

## 2020-04-11 ENCOUNTER — Telehealth: Payer: Self-pay | Admitting: Hematology and Oncology

## 2020-04-11 NOTE — Telephone Encounter (Signed)
Scheduled per 04/22 los, patient has been called and notified.  

## 2020-07-09 ENCOUNTER — Telehealth: Payer: Self-pay | Admitting: Hematology and Oncology

## 2020-07-09 NOTE — Telephone Encounter (Signed)
Rescheduled appointment per 7/1 message. Patient is aware of the updated appointment date and time.

## 2021-01-15 ENCOUNTER — Encounter: Payer: Self-pay | Admitting: Licensed Clinical Social Worker

## 2021-01-15 NOTE — Progress Notes (Signed)
UPDATE: VUS in ATM  C.7919C>T has been reclassified as "Likely Benign." The report date is 01/14/21.

## 2021-02-10 ENCOUNTER — Encounter: Payer: Self-pay | Admitting: Adult Health

## 2021-03-11 ENCOUNTER — Telehealth: Payer: Self-pay | Admitting: Hematology and Oncology

## 2021-03-11 NOTE — Telephone Encounter (Signed)
Rescheduled 4/28 appt. Called pt and confirmed 5/3 appt

## 2021-04-09 ENCOUNTER — Ambulatory Visit: Payer: 59 | Admitting: Hematology and Oncology

## 2021-04-16 ENCOUNTER — Ambulatory Visit: Payer: 59 | Admitting: Hematology and Oncology

## 2021-04-20 NOTE — Progress Notes (Signed)
Patient Care Team: [facility]ll[facility], [facility] as PCP - General (Family Medicine) Eppie Gibson, MD as Attending Physician (Radiation Oncology) Nicholas Lose, MD as Consulting Physician (Hematology and Oncology) Rolm Bookbinder, MD as Consulting Physician (General Surgery)  DIAGNOSIS:    ICD-10-CM   1. Malignant neoplasm of upper-outer quadrant of right breast in female, estrogen receptor positive ([place])  C50.411    Z17.0     SUMMARY OF ONCOLOGIC HISTORY: Oncology History  Malignant neoplasm of upper-outer quadrant of right breast in female, estrogen receptor positive ([place])  02/19/2019 Initial Diagnosis   Screening detected right breast asymmetry by ultrasound measured 7 mm, biopsy revealed grade 2 IDC ER 95%, PR 40%, Ki-67 5%, HER-2 1+ negative, T1BN0 stage Ia clinical stage   02/28/2019 Cancer Staging   Staging form: Breast, AJCC 8th Edition - Clinical stage from 02/28/2019: Stage IA (cT1b, cN0, cM0, G2, ER+, PR+, HER2-) - Signed by Nicholas Lose, MD on 02/28/2019   03/07/2019 Genetic Testing   VUS in ATM called c.[id number]>T was identified on the [facility] Breast Cancer STAT Panel + Common Hereditary Cancers Pane. The STAT Breast cancer panel offered by [facility] includes sequencing and rearrangement analysis for the following 9 genes:  ATM, BRCA1, BRCA2, CDH1, CHEK2, PALB2, PTEN, STK11 and TP53.  The Common Hereditary Cancers Panel offered by [facility] includes sequencing and/or deletion duplication testing of the following 47 genes: APC, ATM, AXIN2, BARD1, BMPR1A, BRCA1, BRCA2, BRIP1, CDH1, CDKN2A (p14ARF), CDKN2A (p16INK4a), CKD4, CHEK2, CTNNA1, DICER1, EPCAM (Deletion/duplication testing only), GREM1 (promoter region deletion/duplication testing only), KIT, MEN1, MLH1, MSH2, MSH3, MSH6, MUTYH, NBN, NF1, NHTL1, PALB2, PDGFRA, PMS2, POLD1, POLE, PTEN, RAD50, RAD51C, RAD51D, SDHB, SDHC, SDHD, SMAD4, SMARCA4. STK11, TP53, TSC1, TSC2, and VHL.  The following genes were evaluated for sequence  changes only: SDHA and HOXB13 c.251G>A variant only. The report date is 03/07/2019.   03/15/2019 Surgery   Right lumpectomy: Grade 2 IDC 1.5 cm, margins negative, 0/4 lymph nodes negative, ER 95%, PR 40%, HER-2 1+, negative, Ki-67 5%, T1c M0 stage Ia   03/15/2019 Oncotype testing   Oncotype DX recurrence score 12, distant recurrence at 9 years: 3%   04/11/2019 Cancer Staging   Staging form: Breast, AJCC 8th Edition - Pathologic: Stage IA (pT1c, pN0, cM0, G2, ER+, PR+, HER2-) - Signed by Eppie Gibson, MD on 04/11/2019   04/2019 -  Anti-estrogen oral therapy   Anastrozole daily   05/29/2019 - 06/18/2019 Radiation Therapy   Adjuvant radiation therapy     CHIEF COMPLIANT: Follow-up of right breast cancer on anastrozole  INTERVAL HISTORY: Sherry Rivera is a 60 y.o. with above-mentioned history of right breast cancer treated with lumpectomy, radiation, and who is currently on anti-estrogen therapy with anastrozole. Mammogram on 02/10/20 showed no evidence of malignancy. She presents to the clinic today for follow-up.   She is tolerating anastrozole extremely well without any problems or concerns but denies any hot flashes or arthralgias or myalgias.  She denies any lumps or nodules in the breast.  She had lost some weight and that has helped significantly with the joint stiffness and achiness.  Her diabetes is also under amazing control since she lost weight.  ALLERGIES:  has No Known Allergies.  MEDICATIONS:  Current Outpatient Medications  Medication Sig Dispense Refill  . acetaminophen (TYLENOL) 500 MG tablet Take 1,000 mg by mouth every 6 (six) hours as needed for headache.    . anastrozole (ARIMIDEX) 1 MG tablet Take 1 tablet (1 mg total) by mouth daily. [place]  tablet 3  . calcium carbonate (OSCAL) 1500 (600 Ca) MG TABS tablet Take 600 mg of elemental calcium by mouth daily.    Marland Kitchen glucosamine-chondroitin 500-400 MG tablet Take 1 tablet by mouth 3 (three) times daily.    . nicotine polacrilex  (COMMIT) 2 MG lozenge Take 2 mg by mouth as needed for smoking cessation.    [name] 3-6-9 Fatty Acids (OMEGA 3-6-9 PO) Take 1 capsule by mouth daily.    . Triamcinolone Acetonide (NASACORT ALLERGY 24HR NA) Place into the nose.     No current facility-administered medications for this visit.    PHYSICAL EXAMINATION: ECOG PERFORMANCE STATUS: 1 - Symptomatic but completely ambulatory  Vitals:   04/21/21 1020  BP: 136/72  Pulse: 88  Resp: 18  Temp: 97.9 F (36.6 C)  SpO2: 98%   Filed Weights   04/21/21 1020  Weight: 158 lb 3.2 oz (71.8 kg)    BREAST: No palpable masses or nodules in either right or left breasts. No palpable axillary supraclavicular or infraclavicular adenopathy no breast tenderness or nipple discharge. (exam performed in the presence of a chaperone)  LABORATORY DATA:  I have reviewed the data as listed CMP Latest Ref Rng & Units 02/28/2019  Glucose 70 - 99 mg/dL 111(H)  BUN 6 - 20 mg/dL 12  Creatinine 0.44 - 1.00 mg/dL 0.74  Sodium 135 - 145 mmol/L 144  Potassium 3.5 - 5.1 mmol/L 4.2  Chloride 98 - 111 mmol/L 109  CO2 22 - 32 mmol/L 25  Calcium 8.9 - 10.3 mg/dL 8.8(L)  Total Protein 6.5 - 8.1 g/dL 6.8  Total Bilirubin 0.3 - 1.2 mg/dL 0.6  Alkaline Phos 38 - 126 U/L 54  AST 15 - 41 U/L 11(L)  ALT 0 - 44 U/L 8    Lab Results  Component Value Date   WBC 6.5 02/28/2019   HGB 13.8 02/28/2019   HCT 42.8 02/28/2019   MCV 94.9 02/28/2019   PLT 235 02/28/2019   NEUTROABS 3.4 02/28/2019    ASSESSMENT & PLAN:  Malignant neoplasm of upper-outer quadrant of right breast in female, estrogen receptor positive ([place]) 02/19/2019:Screening detected right breast asymmetry by ultrasound measured 7 mm, biopsy revealed grade 2 IDC ER 95%, PR 40%, Ki-67 5%, HER-2 1+ negative, T1BN0 stage Ia clinical stage  03/15/2019:Right lumpectomy: Grade 2 IDC 1.5 cm, margins negative, 0/4 lymph nodes negative, ER 95%, PR 40%, HER-2 1+, negative, Ki-67 5%, T1c M0 stage Ia  Oncotype  DX recurrence score 12, distant recurrence at 9 years: 3% Adjuvant radiation therapy: 05/29/2019-06/19/2019  Treatment plan: Adjuvant antiestrogen therapy with anastrozole 1 mg daily x7 yearsstarted 03/14/2019 Anastrozoletoxicities: Denies any hot flashes or arthralgias or myalgias.  Breast cancer surveillance: 1.  Breast exam 04/21/21: Benign 2.  Mammogram 02/09/21: [name], benign, breast density category B  Return to clinic in 1 year for follow-up    No orders of the defined types were placed in this encounter.  The patient has a good understanding of the overall plan. she agrees with it. she will call with any problems that may develop before the next visit here.  Total time spent: 20 mins including face to face time and time spent for planning, charting and coordination of care  Rulon Eisenmenger, MD, MPH 04/21/2021  I, Cloyde Reams Dorshimer, am acting as scribe for Dr. Nicholas Lose.  I have reviewed the above documentation for accuracy and completeness, and I agree with the above.

## 2021-04-20 NOTE — Assessment & Plan Note (Signed)
02/19/2019:Screening detected right breast asymmetry by ultrasound measured 7 mm, biopsy revealed grade 2 IDC ER 95%, PR 40%, Ki-67 5%, HER-2 1+ negative, T1BN0 stage Ia clinical stage  03/15/2019:Right lumpectomy: Grade 2 IDC 1.5 cm, margins negative, 0/4 lymph nodes negative, ER 95%, PR 40%, HER-2 1+, negative, Ki-67 5%, T1c M0 stage Ia  Oncotype DX recurrence score 12, distant recurrence at 9 years: 3% Adjuvant radiation therapy: 05/29/2019-06/19/2019  Treatment plan: Adjuvant antiestrogen therapy with anastrozole 1 mg daily x7 yearsstarted 03/14/2019 Anastrozoletoxicities: Denies any hot flashes or arthralgias or myalgias.  Breast cancer surveillance: 1.  Breast exam 04/21/21: Benign 2.  Mammogram 02/09/21: [name], benign, breast density category B  Return to clinic in 1 year for follow-up

## 2021-04-21 ENCOUNTER — Inpatient Hospital Stay: Payer: 59 | Attending: Hematology and Oncology | Admitting: Hematology and Oncology

## 2021-04-21 ENCOUNTER — Other Ambulatory Visit: Payer: Self-pay

## 2021-04-21 DIAGNOSIS — Z79811 Long term (current) use of aromatase inhibitors: Secondary | ICD-10-CM | POA: Insufficient documentation

## 2021-04-21 DIAGNOSIS — C50411 Malignant neoplasm of upper-outer quadrant of right female breast: Secondary | ICD-10-CM | POA: Insufficient documentation

## 2021-04-21 DIAGNOSIS — Z170 Estrogen receptor positive status [ER+]: Secondary | ICD-10-CM | POA: Insufficient documentation

## 2021-04-21 DIAGNOSIS — E119 Type 2 diabetes mellitus without complications: Secondary | ICD-10-CM | POA: Diagnosis not present

## 2021-04-21 DIAGNOSIS — Z923 Personal history of irradiation: Secondary | ICD-10-CM | POA: Diagnosis not present

## 2021-04-21 MED ORDER — ANASTROZOLE 1 MG PO TABS: 1.0000 mg | ORAL_TABLET | Freq: Every day | ORAL | 3 refills | Status: DC

## 2022-02-16 ENCOUNTER — Encounter: Payer: Self-pay | Admitting: Hematology and Oncology

## 2022-03-22 ENCOUNTER — Other Ambulatory Visit: Payer: Self-pay | Admitting: Hematology and Oncology

## 2022-04-07 NOTE — Progress Notes (Signed)
? ?Patient Care Team: ?[facility], [facility] as PCP - General (Family Medicine) ?Eppie Gibson, MD as Attending Physician (Radiation Oncology) ?Nicholas Lose, MD as Consulting Physician (Hematology and Oncology) ?Rolm Bookbinder, MD as Consulting Physician (General Surgery) ? ?DIAGNOSIS:  ?Encounter Diagnosis  ?Name Primary?  ? Malignant neoplasm of upper-outer quadrant of right breast in female, estrogen receptor positive ([place])   ? ? ?SUMMARY OF ONCOLOGIC HISTORY: ?Oncology History  ?Malignant neoplasm of upper-outer quadrant of right breast in female, estrogen receptor positive ([place])  ?02/19/2019 Initial Diagnosis  ? Screening detected right breast asymmetry by ultrasound measured 7 mm, biopsy revealed grade 2 IDC ER 95%, PR 40%, Ki-67 5%, HER-2 1+ negative, T1BN0 stage Ia clinical stage ? ?  ?02/28/2019 Cancer Staging  ? Staging form: Breast, AJCC 8th Edition ?- Clinical stage from 02/28/2019: Stage IA (cT1b, cN0, cM0, G2, ER+, PR+, HER2-) - Signed by Nicholas Lose, MD on 02/28/2019 ? ?  ?03/07/2019 Genetic Testing  ? VUS in ATM called c.[id number]>T was identified on the [facility] Breast Cancer STAT Panel + Common Hereditary Cancers Pane. The STAT Breast cancer panel offered by [facility] includes sequencing and rearrangement analysis for the following 9 genes:  ATM, BRCA1, BRCA2, CDH1, CHEK2, PALB2, PTEN, STK11 and TP53.  The Common Hereditary Cancers Panel offered by [facility] includes sequencing and/or deletion duplication testing of the following 47 genes: APC, ATM, AXIN2, BARD1, BMPR1A, BRCA1, BRCA2, BRIP1, CDH1, CDKN2A (p14ARF), CDKN2A (p16INK4a), CKD4, CHEK2, CTNNA1, DICER1, EPCAM (Deletion/duplication testing only), GREM1 (promoter region deletion/duplication testing only), KIT, MEN1, MLH1, MSH2, MSH3, MSH6, MUTYH, NBN, NF1, NHTL1, PALB2, PDGFRA, PMS2, POLD1, POLE, PTEN, RAD50, RAD51C, RAD51D, SDHB, SDHC, SDHD, SMAD4, SMARCA4. STK11, TP53, TSC1, TSC2, and VHL.  The following genes were evaluated for  sequence changes only: SDHA and HOXB13 c.251G>A variant only. The report date is 03/07/2019. ? ?  ?03/15/2019 Surgery  ? Right lumpectomy: Grade 2 IDC 1.5 cm, margins negative, 0/4 lymph nodes negative, ER 95%, PR 40%, HER-2 1+, negative, Ki-67 5%, T1c M0 stage Ia ?  ?03/15/2019 Oncotype testing  ? Oncotype DX recurrence score 12, distant recurrence at 9 years: 3% ?  ?04/11/2019 Cancer Staging  ? Staging form: Breast, AJCC 8th Edition ?- Pathologic: Stage IA (pT1c, pN0, cM0, G2, ER+, PR+, HER2-) - Signed by Eppie Gibson, MD on 04/11/2019 ? ?  ?04/2019 -  Anti-estrogen oral therapy  ? Anastrozole daily ?  ?05/29/2019 - 06/18/2019 Radiation Therapy  ? Adjuvant radiation therapy ?  ? ? ?CHIEF COMPLIANT:  Follow-up of right breast cancer on anastrozole ?  ? ?INTERVAL HISTORY: Sherry Rivera is a  61 y.o. with above-mentioned history of right breast cancer treated with lumpectomy, radiation, and who is currently on anti-estrogen therapy with anastrozole. She presents to the clinic today for follow-up. She state that she have loss weight because of her a1c. She state that her activity slowed down because she has to take her dad. She denies hot flashes joint stiffness. ?  ? ? ?ALLERGIES:  has No Known Allergies. ? ?MEDICATIONS:  ?Current Outpatient Medications  ?Medication Sig Dispense Refill  ? acetaminophen (TYLENOL) 500 MG tablet Take 1,000 mg by mouth every 6 (six) hours as needed for headache.    ? anastrozole (ARIMIDEX) 1 MG tablet TAKE 1 TABLET BY MOUTH EVERY DAY 90 tablet 3  ? calcium carbonate (OSCAL) 1500 (600 Ca) MG TABS tablet Take 600 mg of elemental calcium by mouth daily.    ? nicotine polacrilex (COMMIT) 2 MG lozenge Take 2  mg by mouth as needed for smoking cessation.    ? Omega 3-6-9 Fatty Acids (OMEGA 3-6-9 PO) Take 1 capsule by mouth daily.    ? Triamcinolone Acetonide (NASACORT ALLERGY 24HR NA) Place into the nose.    ? ?No current facility-administered medications for this visit.  ? ? ?PHYSICAL  EXAMINATION: ?ECOG PERFORMANCE STATUS: 1 - Symptomatic but completely ambulatory ? ?Vitals:  ? 04/21/22 1030  ?BP: 126/74  ?Pulse: 72  ?Resp: 19  ?Temp: 97.9 ?F (36.6 ?C)  ?SpO2: 100%  ? ?Filed Weights  ? 04/21/22 1030  ?Weight: 154 lb (69.9 kg)  ? ? ?BREAST: No palpable masses or nodules in either right or left breasts. No palpable axillary supraclavicular or infraclavicular adenopathy no breast tenderness or nipple discharge. (exam performed in the presence of a chaperone) ? ?LABORATORY DATA:  ?I have reviewed the data as listed ? ?  Latest Ref Rng & Units 02/28/2019  ?  8:18 AM  ?CMP  ?Glucose 70 - 99 mg/dL 111    ?BUN 6 - 20 mg/dL 12    ?Creatinine 0.44 - 1.00 mg/dL 0.74    ?Sodium 135 - 145 mmol/L 144    ?Potassium 3.5 - 5.1 mmol/L 4.2    ?Chloride 98 - 111 mmol/L 109    ?CO2 22 - 32 mmol/L 25    ?Calcium 8.9 - 10.3 mg/dL 8.8    ?Total Protein 6.5 - 8.1 g/dL 6.8    ?Total Bilirubin 0.3 - 1.2 mg/dL 0.6    ?Alkaline Phos 38 - 126 U/L 54    ?AST 15 - 41 U/L 11    ?ALT 0 - 44 U/L 8    ? ? ?Lab Results  ?Component Value Date  ? WBC 6.5 02/28/2019  ? HGB 13.8 02/28/2019  ? HCT 42.8 02/28/2019  ? MCV 94.9 02/28/2019  ? PLT 235 02/28/2019  ? NEUTROABS 3.4 02/28/2019  ? ? ?ASSESSMENT & PLAN:  ?Malignant neoplasm of upper-outer quadrant of right breast in female, estrogen receptor positive ([place]) ?02/19/2019: Screening detected right breast asymmetry by ultrasound measured 7 mm, biopsy revealed grade 2 IDC ER 95%, PR 40%, Ki-67 5%, HER-2 1+ negative, T1BN0 stage Ia clinical stage ?  ?03/15/2019:Right lumpectomy: Grade 2 IDC 1.5 cm, margins negative, 0/4 lymph nodes negative, ER 95%, PR 40%, HER-2 1+, negative, Ki-67 5%, T1c M0 stage Ia ?  ?Oncotype DX recurrence score 12, distant recurrence at 9 years: 3% ?Adjuvant radiation therapy: 05/29/2019-06/19/2019 ?  ?Treatment plan: Adjuvant antiestrogen therapy with anastrozole 1 mg daily x7 years started 03/14/2019 ?Anastrozole toxicities: ?Denies any hot flashes or arthralgias or  myalgias. ?  ?Breast cancer surveillance: ?1.  Breast exam 04/21/22: Benign ?2.  Mammogram  02/16/2022: [name], benign, breast density category B ? ?Weight: Patient lost about 20 pounds by cutting down the carbs.  She was worried about hemoglobin A1c. ?She and her sister have been taking care of her ailing father. ? ?Return to clinic in 1 year for follow-up ? ? ? ?No orders of the defined types were placed in this encounter. ? ?The patient has a good understanding of the overall plan. she agrees with it. she will call with any problems that may develop before the next visit here. ?Total time spent: 30 mins including face to face time and time spent for planning, charting and co-ordination of care ? ? Harriette Ohara, MD ?04/21/22 ? ? ? I Gardiner Coins am scribing for Dr. Lindi Adie ? ?I have reviewed the above documentation for accuracy  and completeness, and I agree with the above. ?  ?

## 2022-04-21 ENCOUNTER — Inpatient Hospital Stay: Payer: 59 | Attending: Hematology and Oncology | Admitting: Hematology and Oncology

## 2022-04-21 ENCOUNTER — Other Ambulatory Visit: Payer: Self-pay

## 2022-04-21 DIAGNOSIS — C50411 Malignant neoplasm of upper-outer quadrant of right female breast: Secondary | ICD-10-CM | POA: Diagnosis not present

## 2022-04-21 DIAGNOSIS — Z79811 Long term (current) use of aromatase inhibitors: Secondary | ICD-10-CM | POA: Diagnosis not present

## 2022-04-21 DIAGNOSIS — Z170 Estrogen receptor positive status [ER+]: Secondary | ICD-10-CM | POA: Diagnosis not present

## 2022-04-21 DIAGNOSIS — Z923 Personal history of irradiation: Secondary | ICD-10-CM | POA: Insufficient documentation

## 2022-04-21 NOTE — Assessment & Plan Note (Addendum)
02/19/2019:?Screening detected right breast asymmetry by ultrasound measured 7 mm, biopsy revealed grade 2 IDC ER 95%, PR 40%, Ki-67 5%, HER-2 1+ negative, T1BN0 stage Ia clinical stage ?? ?03/15/2019:Right lumpectomy: Grade 2 IDC 1.5 cm, margins negative, 0/4 lymph nodes negative, ER 95%, PR 40%, HER-2 1+, negative, Ki-67 5%, T1c M0 stage Ia ?? ?Oncotype DX recurrence score 12, distant recurrence at 9 years: 3% ?Adjuvant radiation therapy: 05/29/2019-06/19/2019 ?? ?Treatment plan: Adjuvant antiestrogen therapy with anastrozole 1 mg daily x7 years?started 03/14/2019 ?Anastrozole?toxicities: ?Denies any hot flashes?or arthralgias or myalgias. ?? ?Breast cancer surveillance: ?1.??Breast exam 04/21/22: Benign ?2.??Mammogram  02/16/2022: [name], benign, breast density category B ? ?Weight: Patient lost about 20 pounds by cutting down the carbs.  She was worried about hemoglobin A1c. ?? ?Return to clinic in 1 year for follow-up ?

## 2023-02-22 ENCOUNTER — Encounter: Payer: Self-pay | Admitting: Hematology and Oncology

## 2023-03-30 ENCOUNTER — Other Ambulatory Visit: Payer: Self-pay | Admitting: Hematology and Oncology

## 2023-04-23 NOTE — Progress Notes (Signed)
Patient Care Team: [facility]ll[facility], [facility] as PCP - General (Family Medicine) Lonie Peak, MD as Attending Physician (Radiation Oncology) Serena Croissant, MD as Consulting Physician (Hematology and Oncology) Emelia Loron, MD as Consulting Physician (General Surgery)  DIAGNOSIS: No diagnosis found.  SUMMARY OF ONCOLOGIC HISTORY: Oncology History  Malignant neoplasm of upper-outer quadrant of right breast in female, estrogen receptor positive ([facility])  02/19/2019 Initial Diagnosis   Screening detected right breast asymmetry by ultrasound measured 7 mm, biopsy revealed grade 2 IDC ER 95%, PR 40%, Ki-67 5%, HER-2 1+ negative, T1BN0 stage Ia clinical stage   02/28/2019 Cancer Staging   Staging form: Breast, AJCC 8th Edition - Clinical stage from 02/28/2019: Stage IA (cT1b, cN0, cM0, G2, ER+, PR+, HER2-) - Signed by Serena Croissant, MD on 02/28/2019   03/07/2019 Genetic Testing   VUS in ATM called c.7919C>T was identified on the [facility] Breast Cancer STAT Panel + Common Hereditary Cancers Pane. The STAT Breast cancer panel offered by [facility] includes sequencing and rearrangement analysis for the following 9 genes:  ATM, BRCA1, BRCA2, CDH1, CHEK2, PALB2, PTEN, STK11 and TP53.  The Common Hereditary Cancers Panel offered by [facility] includes sequencing and/or deletion duplication testing of the following 47 genes: APC, ATM, AXIN2, BARD1, BMPR1A, BRCA1, BRCA2, BRIP1, CDH1, CDKN2A (p14ARF), CDKN2A (p16INK4a), CKD4, CHEK2, CTNNA1, DICER1, EPCAM (Deletion/duplication testing only), GREM1 (promoter region deletion/duplication testing only), KIT, MEN1, MLH1, MSH2, MSH3, MSH6, MUTYH, NBN, NF1, NHTL1, PALB2, PDGFRA, PMS2, POLD1, POLE, PTEN, RAD50, RAD51C, RAD51D, SDHB, SDHC, SDHD, SMAD4, SMARCA4. STK11, TP53, TSC1, TSC2, and VHL.  The following genes were evaluated for sequence changes only: SDHA and HOXB13 c.251G>A variant only. The report date is 03/07/2019.   03/15/2019 Surgery   Right lumpectomy:  Grade 2 IDC 1.5 cm, margins negative, 0/4 lymph nodes negative, ER 95%, PR 40%, HER-2 1+, negative, Ki-67 5%, T1c M0 stage Ia   03/15/2019 Oncotype testing   Oncotype DX recurrence score 12, distant recurrence at 9 years: 3%   04/11/2019 Cancer Staging   Staging form: Breast, AJCC 8th Edition - Pathologic: Stage IA (pT1c, pN0, cM0, G2, ER+, PR+, HER2-) - Signed by Lonie Peak, MD on 04/11/2019   04/2019 -  Anti-estrogen oral therapy   Anastrozole daily   05/29/2019 - 06/18/2019 Radiation Therapy   Adjuvant radiation therapy     CHIEF COMPLIANT: Follow-up anastrozloe  INTERVAL HISTORY: Sherry Rivera is a 62 y.o. with above-mentioned history of right breast cancer treated with lumpectomy, radiation, and who is currently on anti-estrogen therapy with anastrozole. She presents to the clinic today for follow-up.     ALLERGIES:  has No Known Allergies.  MEDICATIONS:  Current Outpatient Medications  Medication Sig Dispense Refill   acetaminophen (TYLENOL) 500 MG tablet Take 1,000 mg by mouth every 6 (six) hours as needed for headache.     anastrozole (ARIMIDEX) 1 MG tablet TAKE 1 TABLET BY MOUTH EVERY DAY 90 tablet 3   calcium carbonate (OSCAL) 1500 (600 Ca) MG TABS tablet Take 600 mg of elemental calcium by mouth daily.     nicotine polacrilex (COMMIT) 2 MG lozenge Take 2 mg by mouth as needed for smoking cessation.     Omega 3-6-9 Fatty Acids (OMEGA 3-6-9 PO) Take 1 capsule by mouth daily.     Triamcinolone Acetonide (NASACORT ALLERGY 24HR NA) Place into the nose.     No current facility-administered medications for this visit.    PHYSICAL EXAMINATION: ECOG PERFORMANCE STATUS: {CHL ONC ECOG [phone]}  There were no  vitals filed for this visit. There were no vitals filed for this visit.  BREAST:*** No palpable masses or nodules in either right or left breasts. No palpable axillary supraclavicular or infraclavicular adenopathy no breast tenderness or nipple discharge. (exam  performed in the presence of a chaperone)  LABORATORY DATA:  I have reviewed the data as listed    Latest Ref Rng & Units 02/28/2019    8:18 AM  CMP  Glucose 70 - 99 mg/dL 161   BUN 6 - 20 mg/dL 12   Creatinine 0.96 - 1.00 mg/dL 0.45   Sodium 409 - 811 mmol/L 144   Potassium 3.5 - 5.1 mmol/L 4.2   Chloride 98 - 111 mmol/L 109   CO2 22 - 32 mmol/L 25   Calcium 8.9 - 10.3 mg/dL 8.8   Total Protein 6.5 - 8.1 g/dL 6.8   Total Bilirubin 0.3 - 1.2 mg/dL 0.6   Alkaline Phos 38 - 126 U/L 54   AST 15 - 41 U/L 11   ALT 0 - 44 U/L 8     Lab Results  Component Value Date   WBC 6.5 02/28/2019   HGB 13.8 02/28/2019   HCT 42.8 02/28/2019   MCV 94.9 02/28/2019   PLT 235 02/28/2019   NEUTROABS 3.4 02/28/2019    ASSESSMENT & PLAN:  No problem-specific Assessment & Plan notes found for this encounter.    No orders of the defined types were placed in this encounter.  The patient has a good understanding of the overall plan. she agrees with it. she will call with any problems that may develop before the next visit here. Total time spent: 30 mins including face to face time and time spent for planning, charting and co-ordination of care   Sherlyn Lick, CMA 04/23/23    I Janan Ridge am acting as a Neurosurgeon for [employer]  ***

## 2023-04-25 ENCOUNTER — Inpatient Hospital Stay: Payer: 59 | Attending: Hematology and Oncology | Admitting: Hematology and Oncology

## 2023-04-25 VITALS — BP 135/56 | HR 78 | Temp 97.3°F | Resp 18 | Ht 65.0 in | Wt 152.2 lb

## 2023-04-25 DIAGNOSIS — C50411 Malignant neoplasm of upper-outer quadrant of right female breast: Secondary | ICD-10-CM | POA: Insufficient documentation

## 2023-04-25 DIAGNOSIS — Z170 Estrogen receptor positive status [ER+]: Secondary | ICD-10-CM

## 2023-04-25 DIAGNOSIS — Z79811 Long term (current) use of aromatase inhibitors: Secondary | ICD-10-CM | POA: Diagnosis present

## 2023-04-25 DIAGNOSIS — Z923 Personal history of irradiation: Secondary | ICD-10-CM | POA: Diagnosis not present

## 2023-04-25 NOTE — Assessment & Plan Note (Signed)
02/19/2019: Screening detected right breast asymmetry by ultrasound measured 7 mm, biopsy revealed grade 2 IDC ER 95%, PR 40%, Ki-67 5%, HER-2 1+ negative, T1BN0 stage Ia clinical stage   03/15/2019:Right lumpectomy: Grade 2 IDC 1.5 cm, margins negative, 0/4 lymph nodes negative, ER 95%, PR 40%, HER-2 1+, negative, Ki-67 5%, T1c M0 stage Ia   Oncotype DX recurrence score 12, distant recurrence at 9 years: 3% Adjuvant radiation therapy: 05/29/2019-06/19/2019   Treatment plan: Adjuvant antiestrogen therapy with anastrozole 1 mg daily x7 years started 03/14/2019 Anastrozole toxicities: Denies any hot flashes or arthralgias or myalgias.   Breast cancer surveillance: 1.  Breast exam 04/25/2023: Benign 2.  Mammogram 02/17/2023: [name], benign, breast density category B   Weight: Patient lost about 20 pounds by cutting down the carbs.  She was worried about hemoglobin A1c. She and her sister have been taking care of her ailing father.   Return to clinic in 1 year for follow-up

## 2023-07-28 ENCOUNTER — Other Ambulatory Visit (HOSPITAL_COMMUNITY): Payer: Self-pay | Admitting: Family Medicine

## 2023-07-28 DIAGNOSIS — F17211 Nicotine dependence, cigarettes, in remission: Secondary | ICD-10-CM

## 2023-08-11 ENCOUNTER — Ambulatory Visit (HOSPITAL_COMMUNITY)
Admission: RE | Admit: 2023-08-11 | Discharge: 2023-08-11 | Disposition: A | Discharge status: Home / Self Care | Payer: 59 | Source: Ambulatory Visit | Attending: Family Medicine | Admitting: Family Medicine

## 2023-08-11 DIAGNOSIS — F17211 Nicotine dependence, cigarettes, in remission: Secondary | ICD-10-CM | POA: Insufficient documentation

## 2024-02-24 ENCOUNTER — Encounter: Payer: Self-pay | Admitting: Hematology and Oncology

## 2024-03-20 ENCOUNTER — Other Ambulatory Visit: Payer: Self-pay | Admitting: Hematology and Oncology

## 2024-04-24 ENCOUNTER — Inpatient Hospital Stay: Payer: 59 | Attending: Hematology and Oncology | Admitting: Hematology and Oncology

## 2024-04-24 VITALS — BP 118/82 | HR 79 | Temp 98.2°F | Resp 18 | Wt 148.5 lb

## 2024-04-24 DIAGNOSIS — C50411 Malignant neoplasm of upper-outer quadrant of right female breast: Secondary | ICD-10-CM

## 2024-04-24 DIAGNOSIS — Z923 Personal history of irradiation: Secondary | ICD-10-CM | POA: Diagnosis not present

## 2024-04-24 DIAGNOSIS — Z79811 Long term (current) use of aromatase inhibitors: Secondary | ICD-10-CM | POA: Diagnosis not present

## 2024-04-24 DIAGNOSIS — Z170 Estrogen receptor positive status [ER+]: Secondary | ICD-10-CM | POA: Diagnosis not present

## 2024-04-24 MED ORDER — ANASTROZOLE 1 MG PO TABS: 1.0000 mg | ORAL_TABLET | Freq: Every day | ORAL | 3 refills | Status: AC

## 2024-04-24 NOTE — Progress Notes (Signed)
 Patient Care Team: [facility], [facility] as PCP - General (Family Medicine) Colie Dawes, MD as Attending Physician (Radiation Oncology) Cameron Cea, MD as Consulting Physician (Hematology and Oncology) Enid Harry, MD as Consulting Physician (General Surgery)  DIAGNOSIS:  Encounter Diagnosis  Name Primary?   Malignant neoplasm of upper-outer quadrant of right breast in female, estrogen receptor positive (HCC) Yes    SUMMARY OF ONCOLOGIC HISTORY: Oncology History  Malignant neoplasm of upper-outer quadrant of right breast in female, estrogen receptor positive (HCC)  02/19/2019 Initial Diagnosis   Screening detected right breast asymmetry by ultrasound measured 7 mm, biopsy revealed grade 2 IDC ER 95%, PR 40%, Ki-67 5%, HER-2 1+ negative, T1BN0 stage Ia clinical stage   02/28/2019 Cancer Staging   Staging form: Breast, AJCC 8th Edition - Clinical stage from 02/28/2019: Stage IA (cT1b, cN0, cM0, G2, ER+, PR+, HER2-) - Signed by Cameron Cea, MD on 02/28/2019   03/07/2019 Genetic Testing   VUS in ATM called [id number] was identified on the [facility] Breast Cancer STAT Panel + Common Hereditary Cancers Pane. The STAT Breast cancer panel offered by [facility] includes sequencing and rearrangement analysis for the following 9 genes:  ATM, BRCA1, BRCA2, CDH1, CHEK2, PALB2, PTEN, STK11 and TP53.  The Common Hereditary Cancers Panel offered by [facility] includes sequencing and/or deletion duplication testing of the following 47 genes: APC, ATM, AXIN2, BARD1, BMPR1A, BRCA1, BRCA2, BRIP1, CDH1, CDKN2A (p14ARF), CDKN2A (p16INK4a), CKD4, CHEK2, CTNNA1, DICER1, EPCAM (Deletion/duplication testing only), GREM1 (promoter region deletion/duplication testing only), KIT, MEN1, MLH1, MSH2, MSH3, MSH6, MUTYH, NBN, NF1, NHTL1, PALB2, PDGFRA, PMS2, POLD1, POLE, PTEN, RAD50, RAD51C, RAD51D, SDHB, SDHC, SDHD, SMAD4, SMARCA4. STK11, TP53, TSC1, TSC2, and VHL.  The following genes were evaluated for sequence  changes only: SDHA and HOXB13 c.[id number] variant only. The report date is 03/07/2019.   03/15/2019 Surgery   Right lumpectomy: Grade 2 IDC 1.5 cm, margins negative, 0/4 lymph nodes negative, ER 95%, PR 40%, HER-2 1+, negative, Ki-67 5%, T1c M0 stage Ia   03/15/2019 Oncotype testing   Oncotype DX recurrence score 12, distant recurrence at 9 years: 3%   04/11/2019 Cancer Staging   Staging form: Breast, AJCC 8th Edition - Pathologic: Stage IA (pT1c, pN0, cM0, G2, ER+, PR+, HER2-) - Signed by Colie Dawes, MD on 04/11/2019   04/2019 -  Anti-estrogen oral therapy   Anastrozole  daily   05/29/2019 - 06/18/2019 Radiation Therapy   Adjuvant radiation therapy     CHIEF COMPLIANT: Follow-up on anastrozole  therapy and surveillance of breast cancer  HISTORY OF PRESENT ILLNESS:  History of Present Illness Sherry Rivera is a 63 year old female with breast cancer who presents for a follow-up visit.  She has lost approximately 40 pounds, with her weight decreasing from 190 pounds to 148 pounds. She attributes this to regular exercise, using an elliptical machine at least four days a week. She has been on anastrozole  for five years without issues and will continue for a couple more years. Her mammograms have been clear, with the most recent one in March showing no abnormalities. She experiences no breast pain or discomfort.     ALLERGIES:  has no known allergies.  MEDICATIONS:  Current Outpatient Medications  Medication Sig Dispense Refill   acetaminophen  (TYLENOL ) 500 MG tablet Take 1,000 mg by mouth every 6 (six) hours as needed for headache.     calcium carbonate (OSCAL) 1500 (600 Ca) MG TABS tablet Take 600 mg of elemental calcium by mouth daily.  nicotine polacrilex (COMMIT) 2 MG lozenge Take 2 mg by mouth as needed for smoking cessation.     Omega 3-6-9 Fatty Acids (OMEGA 3-6-9 PO) Take 1 capsule by mouth daily.     Triamcinolone Acetonide (NASACORT ALLERGY 24HR NA) Place into the nose.      anastrozole  (ARIMIDEX ) 1 MG tablet Take 1 tablet (1 mg total) by mouth daily. 90 tablet 3   No current facility-administered medications for this visit.    PHYSICAL EXAMINATION: ECOG PERFORMANCE STATUS: 1 - Symptomatic but completely ambulatory  Vitals:   04/24/24 1021  BP: 118/82  Pulse: 79  Resp: 18  Temp: 98.2 F (36.8 C)  SpO2: 100%   Filed Weights   04/24/24 1021  Weight: 148 lb 8 oz (67.4 kg)    Physical Exam MEASUREMENTS: Weight- 148.  (exam performed in the presence of a chaperone)  LABORATORY DATA:  I have reviewed the data as listed    Latest Ref Rng & Units 02/28/2019    8:18 AM  CMP  Glucose 70 - 99 mg/dL 960   BUN 6 - 20 mg/dL 12   Creatinine 4.54 - 1.00 mg/dL 0.98   Sodium 119 - 147 mmol/L 144   Potassium 3.5 - 5.1 mmol/L 4.2   Chloride 98 - 111 mmol/L 109   CO2 22 - 32 mmol/L 25   Calcium 8.9 - 10.3 mg/dL 8.8   Total Protein 6.5 - 8.1 g/dL 6.8   Total Bilirubin 0.3 - 1.2 mg/dL 0.6   Alkaline Phos 38 - 126 U/L 54   AST 15 - 41 U/L 11   ALT 0 - 44 U/L 8     Lab Results  Component Value Date   WBC 6.5 02/28/2019   HGB 13.8 02/28/2019   HCT 42.8 02/28/2019   MCV 94.9 02/28/2019   PLT 235 02/28/2019   NEUTROABS 3.4 02/28/2019    ASSESSMENT & PLAN:  Malignant neoplasm of upper-outer quadrant of right breast in female, estrogen receptor positive (HCC) 02/19/2019: Screening detected right breast asymmetry by ultrasound measured 7 mm, biopsy revealed grade 2 IDC ER 95%, PR 40%, Ki-67 5%, HER-2 1+ negative, T1BN0 stage Ia clinical stage   03/15/2019:Right lumpectomy: Grade 2 IDC 1.5 cm, margins negative, 0/4 lymph nodes negative, ER 95%, PR 40%, HER-2 1+, negative, Ki-67 5%, T1c M0 stage Ia   Oncotype DX recurrence score 12, distant recurrence at 9 years: 3% Adjuvant radiation therapy: 05/29/2019-06/19/2019   Treatment plan: Adjuvant antiestrogen therapy with anastrozole  1 mg daily x7 years started 03/14/2019 Anastrozole  toxicities: Denies any hot  flashes or arthralgias or myalgias.   Breast cancer surveillance: 1.  Breast exam 04/24/2024: Benign 2.  Mammogram 02/23/2024: [name], benign, breast density category B   Weight: Patient lost about 30 pounds by cutting down the carbs.  Her father passed away in 01-04-2023. She is planning to go to [facility] for trout fishing.     Return to clinic in 1 year for follow-up    No orders of the defined types were placed in this encounter.  The patient has a good understanding of the overall plan. she agrees with it. she will call with any problems that may develop before the next visit here. Total time spent: 30 mins including face to face time and time spent for planning, charting and co-ordination of care   Margert Sheerer, MD 04/24/24

## 2024-04-24 NOTE — Assessment & Plan Note (Signed)
 02/19/2019: Screening detected right breast asymmetry by ultrasound measured 7 mm, biopsy revealed grade 2 IDC ER 95%, PR 40%, Ki-67 5%, HER-2 1+ negative, T1BN0 stage Ia clinical stage   03/15/2019:Right lumpectomy: Grade 2 IDC 1.5 cm, margins negative, 0/4 lymph nodes negative, ER 95%, PR 40%, HER-2 1+, negative, Ki-67 5%, T1c M0 stage Ia   Oncotype DX recurrence score 12, distant recurrence at 9 years: 3% Adjuvant radiation therapy: 05/29/2019-06/19/2019   Treatment plan: Adjuvant antiestrogen therapy with anastrozole  1 mg daily x7 years started 03/14/2019 Anastrozole  toxicities: Denies any hot flashes or arthralgias or myalgias.   Breast cancer surveillance: 1.  Breast exam 04/24/2024: Benign 2.  Mammogram 02/23/2024: [name], benign, breast density category B   Weight: Patient lost about 30 pounds by cutting down the carbs.  Her father passed away in January 10, 2023. She is planning to go to [facility] for trout fishing.     Return to clinic in 1 year for follow-up

## 2024-12-06 ENCOUNTER — Telehealth: Payer: Self-pay

## 2024-12-06 NOTE — Telephone Encounter (Signed)
 Returned call from VM.  Patient has had previous LDCT with her PCP.  She received an email from [facility] about scheduling LDCT so she called to schedule. Advised she would need the sdmv call with us  to be schedule under our services.  Explained we have to do the call before we order the initial scan with our program.  Patient states she will request her PCP order it, as she has previously done.

## 2025-04-25 ENCOUNTER — Ambulatory Visit: Admitting: Hematology and Oncology
